# Patient Record
Sex: Female | Born: 1965 | Race: White | Hispanic: No | Marital: Married | Smoking: Never smoker
Health system: Southern US, Community
[De-identification: ages and names within clinical notes are randomized; demographics above are authoritative.]

## PROBLEM LIST (undated history)

## (undated) ENCOUNTER — Emergency Department (HOSPITAL_COMMUNITY): Admission: EM | Payer: BC Managed Care – PPO | Source: Home / Self Care

## (undated) DIAGNOSIS — D649 Anemia, unspecified: Secondary | ICD-10-CM

## (undated) DIAGNOSIS — U071 COVID-19: Secondary | ICD-10-CM

## (undated) DIAGNOSIS — E876 Hypokalemia: Secondary | ICD-10-CM

## (undated) DIAGNOSIS — G43909 Migraine, unspecified, not intractable, without status migrainosus: Secondary | ICD-10-CM

## (undated) DIAGNOSIS — N83202 Unspecified ovarian cyst, left side: Secondary | ICD-10-CM

## (undated) DIAGNOSIS — T7840XA Allergy, unspecified, initial encounter: Secondary | ICD-10-CM

## (undated) DIAGNOSIS — J189 Pneumonia, unspecified organism: Secondary | ICD-10-CM

## (undated) HISTORY — DX: COVID-19: U07.1

## (undated) HISTORY — DX: Migraine, unspecified, not intractable, without status migrainosus: G43.909

## (undated) HISTORY — DX: Unspecified ovarian cyst, left side: N83.202

## (undated) HISTORY — PX: CHOLECYSTECTOMY: SHX55

## (undated) HISTORY — DX: Anemia, unspecified: D64.9

## (undated) HISTORY — DX: Pneumonia, unspecified organism: J18.9

## (undated) HISTORY — DX: Hypokalemia: E87.6

## (undated) HISTORY — DX: Allergy, unspecified, initial encounter: T78.40XA

## (undated) HISTORY — PX: FOOT SURGERY: SHX648

---

## 1994-06-04 HISTORY — PX: PELVIC LAPAROSCOPY: SHX162

## 1996-04-29 LAB — US OB TRANSVAGINAL

## 1997-06-04 HISTORY — PX: BREAST SURGERY: SHX581

## 1998-02-18 ENCOUNTER — Ambulatory Visit (HOSPITAL_BASED_OUTPATIENT_CLINIC_OR_DEPARTMENT_OTHER): Admission: RE | Admit: 1998-02-18 | Discharge: 1998-02-18 | Payer: Self-pay | Admitting: Surgery

## 1998-04-11 ENCOUNTER — Other Ambulatory Visit: Admission: RE | Admit: 1998-04-11 | Discharge: 1998-04-11 | Payer: Self-pay | Admitting: Obstetrics and Gynecology

## 1999-05-16 ENCOUNTER — Other Ambulatory Visit: Admission: RE | Admit: 1999-05-16 | Discharge: 1999-05-16 | Payer: Self-pay | Admitting: Obstetrics and Gynecology

## 2000-04-08 ENCOUNTER — Other Ambulatory Visit: Admission: RE | Admit: 2000-04-08 | Discharge: 2000-04-08 | Payer: Self-pay | Admitting: Obstetrics and Gynecology

## 2000-10-11 ENCOUNTER — Other Ambulatory Visit: Admission: RE | Admit: 2000-10-11 | Discharge: 2000-10-11 | Payer: Self-pay | Admitting: Gynecology

## 2001-04-17 ENCOUNTER — Inpatient Hospital Stay (HOSPITAL_COMMUNITY): Admission: AD | Admit: 2001-04-17 | Discharge: 2001-04-20 | Payer: Self-pay | Admitting: Gynecology

## 2001-05-30 ENCOUNTER — Other Ambulatory Visit: Admission: RE | Admit: 2001-05-30 | Discharge: 2001-05-30 | Payer: Self-pay | Admitting: Gynecology

## 2002-04-01 ENCOUNTER — Other Ambulatory Visit: Admission: RE | Admit: 2002-04-01 | Discharge: 2002-04-01 | Payer: Self-pay | Admitting: Gynecology

## 2003-12-14 ENCOUNTER — Other Ambulatory Visit: Admission: RE | Admit: 2003-12-14 | Discharge: 2003-12-14 | Payer: Self-pay | Admitting: Gynecology

## 2004-06-16 ENCOUNTER — Ambulatory Visit: Payer: Self-pay | Admitting: Internal Medicine

## 2004-08-01 ENCOUNTER — Ambulatory Visit: Payer: Self-pay | Admitting: Internal Medicine

## 2005-01-01 ENCOUNTER — Other Ambulatory Visit: Admission: RE | Admit: 2005-01-01 | Discharge: 2005-01-01 | Payer: Self-pay | Admitting: Gynecology

## 2005-02-09 ENCOUNTER — Ambulatory Visit: Payer: Self-pay | Admitting: Internal Medicine

## 2005-04-25 ENCOUNTER — Ambulatory Visit: Payer: Self-pay | Admitting: Internal Medicine

## 2005-05-25 ENCOUNTER — Ambulatory Visit: Payer: Self-pay | Admitting: Internal Medicine

## 2006-01-15 ENCOUNTER — Ambulatory Visit: Payer: Self-pay | Admitting: Internal Medicine

## 2006-03-04 ENCOUNTER — Other Ambulatory Visit: Admission: RE | Admit: 2006-03-04 | Discharge: 2006-03-04 | Payer: Self-pay | Admitting: Gynecology

## 2006-07-12 ENCOUNTER — Ambulatory Visit: Payer: Self-pay | Admitting: Internal Medicine

## 2006-11-26 ENCOUNTER — Ambulatory Visit: Payer: Self-pay | Admitting: Internal Medicine

## 2006-12-12 ENCOUNTER — Ambulatory Visit: Payer: Self-pay | Admitting: Internal Medicine

## 2006-12-12 ENCOUNTER — Encounter: Payer: Self-pay | Admitting: Internal Medicine

## 2006-12-12 DIAGNOSIS — N809 Endometriosis, unspecified: Secondary | ICD-10-CM | POA: Insufficient documentation

## 2006-12-19 ENCOUNTER — Ambulatory Visit: Payer: Self-pay | Admitting: Gastroenterology

## 2006-12-19 LAB — CONVERTED CEMR LAB
BUN: 6 mg/dL (ref 6–23)
Basophils Absolute: 0.1 10*3/uL (ref 0.0–0.1)
Bilirubin, Direct: 0.1 mg/dL (ref 0.0–0.3)
CO2: 33 meq/L — ABNORMAL HIGH (ref 19–32)
Eosinophils Absolute: 0 10*3/uL (ref 0.0–0.6)
GFR calc Af Amer: 143 mL/min
GFR calc non Af Amer: 118 mL/min
Glucose, Bld: 89 mg/dL (ref 70–99)
HCT: 36.4 % (ref 36.0–46.0)
MCHC: 33.5 g/dL (ref 30.0–36.0)
MCV: 93.4 fL (ref 78.0–100.0)
Monocytes Absolute: 0.4 10*3/uL (ref 0.2–0.7)
Monocytes Relative: 5.7 % (ref 3.0–11.0)
Neutrophils Relative %: 67.8 % (ref 43.0–77.0)
Potassium: 3.5 meq/L (ref 3.5–5.1)
RBC: 3.9 M/uL (ref 3.87–5.11)
Saturation Ratios: 23 % (ref 20.0–50.0)
Sed Rate: 6 mm/hr (ref 0–25)
Total Protein: 7.1 g/dL (ref 6.0–8.3)
Transferrin: 304.4 mg/dL (ref >=212.0)
Vitamin B-12: 1177 pg/mL — ABNORMAL HIGH (ref 211–911)

## 2006-12-25 ENCOUNTER — Ambulatory Visit: Payer: Self-pay | Admitting: Gastroenterology

## 2007-01-10 ENCOUNTER — Ambulatory Visit: Payer: Self-pay | Admitting: Gastroenterology

## 2007-03-14 ENCOUNTER — Other Ambulatory Visit: Admission: RE | Admit: 2007-03-14 | Discharge: 2007-03-14 | Payer: Self-pay | Admitting: Gynecology

## 2007-08-08 ENCOUNTER — Ambulatory Visit: Payer: Self-pay | Admitting: Internal Medicine

## 2007-08-08 DIAGNOSIS — J45909 Unspecified asthma, uncomplicated: Secondary | ICD-10-CM | POA: Insufficient documentation

## 2007-08-08 DIAGNOSIS — J069 Acute upper respiratory infection, unspecified: Secondary | ICD-10-CM | POA: Insufficient documentation

## 2007-08-08 DIAGNOSIS — J309 Allergic rhinitis, unspecified: Secondary | ICD-10-CM | POA: Insufficient documentation

## 2007-09-06 DIAGNOSIS — K649 Unspecified hemorrhoids: Secondary | ICD-10-CM | POA: Insufficient documentation

## 2007-09-06 DIAGNOSIS — Z8719 Personal history of other diseases of the digestive system: Secondary | ICD-10-CM | POA: Insufficient documentation

## 2007-12-25 ENCOUNTER — Encounter: Payer: Self-pay | Admitting: Internal Medicine

## 2008-01-06 ENCOUNTER — Ambulatory Visit: Payer: Self-pay | Admitting: Internal Medicine

## 2008-01-20 ENCOUNTER — Ambulatory Visit: Payer: Self-pay | Admitting: Internal Medicine

## 2008-01-22 ENCOUNTER — Telehealth: Payer: Self-pay | Admitting: Internal Medicine

## 2008-01-30 ENCOUNTER — Ambulatory Visit: Payer: Self-pay | Admitting: Internal Medicine

## 2008-02-26 ENCOUNTER — Telehealth: Payer: Self-pay | Admitting: Internal Medicine

## 2008-05-03 ENCOUNTER — Ambulatory Visit: Payer: Self-pay | Admitting: Internal Medicine

## 2008-06-04 ENCOUNTER — Emergency Department (HOSPITAL_COMMUNITY): Admission: EM | Admit: 2008-06-04 | Discharge: 2008-06-04 | Payer: Self-pay | Admitting: Emergency Medicine

## 2008-06-14 ENCOUNTER — Ambulatory Visit: Payer: Self-pay | Admitting: Gynecology

## 2008-06-14 ENCOUNTER — Encounter: Payer: Self-pay | Admitting: Gynecology

## 2008-06-14 ENCOUNTER — Other Ambulatory Visit: Admission: RE | Admit: 2008-06-14 | Discharge: 2008-06-14 | Payer: Self-pay | Admitting: Gynecology

## 2008-07-27 ENCOUNTER — Encounter: Payer: Self-pay | Admitting: Internal Medicine

## 2008-07-29 ENCOUNTER — Telehealth: Payer: Self-pay | Admitting: Internal Medicine

## 2008-08-03 ENCOUNTER — Ambulatory Visit: Payer: Self-pay | Admitting: Internal Medicine

## 2008-08-03 DIAGNOSIS — H53129 Transient visual loss, unspecified eye: Secondary | ICD-10-CM | POA: Insufficient documentation

## 2008-08-19 ENCOUNTER — Encounter: Payer: Self-pay | Admitting: Internal Medicine

## 2008-08-19 ENCOUNTER — Ambulatory Visit: Payer: Self-pay

## 2008-10-08 ENCOUNTER — Ambulatory Visit: Payer: Self-pay | Admitting: Gynecology

## 2008-10-25 ENCOUNTER — Encounter: Payer: Self-pay | Admitting: Internal Medicine

## 2008-11-02 ENCOUNTER — Ambulatory Visit: Payer: Self-pay | Admitting: Gynecology

## 2008-12-22 ENCOUNTER — Ambulatory Visit (HOSPITAL_COMMUNITY): Admission: RE | Admit: 2008-12-22 | Discharge: 2008-12-22 | Payer: Self-pay | Admitting: Gastroenterology

## 2008-12-28 ENCOUNTER — Encounter: Payer: Self-pay | Admitting: Family Medicine

## 2008-12-29 ENCOUNTER — Ambulatory Visit: Payer: Self-pay | Admitting: Family Medicine

## 2008-12-29 DIAGNOSIS — R634 Abnormal weight loss: Secondary | ICD-10-CM | POA: Insufficient documentation

## 2008-12-29 DIAGNOSIS — E876 Hypokalemia: Secondary | ICD-10-CM | POA: Insufficient documentation

## 2008-12-29 DIAGNOSIS — R209 Unspecified disturbances of skin sensation: Secondary | ICD-10-CM | POA: Insufficient documentation

## 2008-12-29 DIAGNOSIS — R197 Diarrhea, unspecified: Secondary | ICD-10-CM | POA: Insufficient documentation

## 2008-12-30 ENCOUNTER — Encounter: Payer: Self-pay | Admitting: Internal Medicine

## 2008-12-30 ENCOUNTER — Telehealth: Payer: Self-pay | Admitting: Family Medicine

## 2008-12-30 ENCOUNTER — Ambulatory Visit: Payer: Self-pay | Admitting: Family Medicine

## 2008-12-31 LAB — CONVERTED CEMR LAB
Basophils Absolute: 0.1 10*3/uL (ref 0.0–0.1)
Bilirubin, Direct: 0.1 mg/dL (ref 0.0–0.3)
CO2: 30 meq/L (ref 19–32)
Calcium: 9.4 mg/dL (ref 8.4–10.5)
Creatinine, Ser: 0.6 mg/dL (ref 0.4–1.2)
Eosinophils Absolute: 0.1 10*3/uL (ref 0.0–0.7)
GFR calc non Af Amer: 116.51 mL/min (ref >60)
HCT: 41.4 % (ref 36.0–46.0)
Hemoglobin: 14.2 g/dL (ref 12.0–15.0)
Lymphs Abs: 2.9 10*3/uL (ref 0.7–4.0)
MCHC: 34.4 g/dL (ref 30.0–36.0)
MCV: 92.8 fL (ref 78.0–100.0)
Monocytes Absolute: 0.7 10*3/uL (ref 0.1–1.0)
Neutro Abs: 4.7 10*3/uL (ref 1.4–7.7)
RDW: 12.2 % (ref 11.5–14.6)
TSH: 1.47 microintl units/mL (ref 0.35–5.50)
Total Protein: 7.3 g/dL (ref 6.0–8.3)
Vitamin B-12: 327 pg/mL (ref 211–911)

## 2009-01-07 ENCOUNTER — Ambulatory Visit (HOSPITAL_COMMUNITY): Admission: RE | Admit: 2009-01-07 | Discharge: 2009-01-07 | Payer: Self-pay | Admitting: Gastroenterology

## 2009-01-28 ENCOUNTER — Encounter: Payer: Self-pay | Admitting: Internal Medicine

## 2009-02-16 ENCOUNTER — Encounter (INDEPENDENT_AMBULATORY_CARE_PROVIDER_SITE_OTHER): Payer: Self-pay | Admitting: General Surgery

## 2009-02-16 ENCOUNTER — Ambulatory Visit (HOSPITAL_COMMUNITY): Admission: RE | Admit: 2009-02-16 | Discharge: 2009-02-16 | Payer: Self-pay | Admitting: General Surgery

## 2009-02-17 ENCOUNTER — Ambulatory Visit: Payer: Self-pay | Admitting: Vascular Surgery

## 2009-02-17 ENCOUNTER — Ambulatory Visit: Admission: RE | Admit: 2009-02-17 | Discharge: 2009-02-17 | Payer: Self-pay | Admitting: General Surgery

## 2009-02-17 ENCOUNTER — Encounter (INDEPENDENT_AMBULATORY_CARE_PROVIDER_SITE_OTHER): Payer: Self-pay | Admitting: General Surgery

## 2009-03-08 ENCOUNTER — Encounter: Payer: Self-pay | Admitting: Internal Medicine

## 2009-04-08 ENCOUNTER — Ambulatory Visit: Payer: Self-pay | Admitting: Internal Medicine

## 2009-04-12 ENCOUNTER — Ambulatory Visit: Payer: Self-pay | Admitting: Internal Medicine

## 2009-04-12 ENCOUNTER — Telehealth: Payer: Self-pay | Admitting: Internal Medicine

## 2009-04-12 LAB — CONVERTED CEMR LAB: Potassium: 3.9 meq/L (ref 3.5–5.1)

## 2009-05-03 ENCOUNTER — Encounter (INDEPENDENT_AMBULATORY_CARE_PROVIDER_SITE_OTHER): Payer: Self-pay | Admitting: *Deleted

## 2009-05-23 ENCOUNTER — Encounter: Payer: Self-pay | Admitting: Internal Medicine

## 2009-06-17 ENCOUNTER — Ambulatory Visit: Payer: Self-pay | Admitting: Internal Medicine

## 2009-06-22 ENCOUNTER — Telehealth: Payer: Self-pay | Admitting: Internal Medicine

## 2009-06-23 ENCOUNTER — Encounter: Payer: Self-pay | Admitting: Internal Medicine

## 2009-09-22 ENCOUNTER — Telehealth: Payer: Self-pay | Admitting: Internal Medicine

## 2009-10-13 ENCOUNTER — Ambulatory Visit: Payer: Self-pay | Admitting: Internal Medicine

## 2009-10-13 LAB — CONVERTED CEMR LAB
AST: 28 units/L (ref 0–37)
Albumin: 4.7 g/dL (ref 3.5–5.2)
Alkaline Phosphatase: 52 units/L (ref 39–117)
Barbiturate Quant, Ur: NEGATIVE
Basophils Relative: 0.5 % (ref 0.0–3.0)
Blood in Urine, dipstick: NEGATIVE
Calcium: 9.5 mg/dL (ref 8.4–10.5)
Cocaine Metabolites: NEGATIVE
Creatinine,U: 265.5 mg/dL
GFR calc non Af Amer: 102.19 mL/min (ref >60)
HDL: 51.8 mg/dL (ref >39.00)
Hemoglobin: 14.2 g/dL (ref 12.0–15.0)
Lymphocytes Relative: 31.4 % (ref 12.0–46.0)
Marijuana Metabolite: NEGATIVE
Monocytes Relative: 8.2 % (ref 3.0–12.0)
Neutro Abs: 4.8 10*3/uL (ref 1.4–7.7)
Nitrite: NEGATIVE
Opiate Screen, Urine: NEGATIVE
Propoxyphene: NEGATIVE
RBC: 4.57 M/uL (ref 3.87–5.11)
Sodium: 142 meq/L (ref 135–145)
Specific Gravity, Urine: >=1.03
Total CHOL/HDL Ratio: 4
Total Protein: 6.9 g/dL (ref 6.0–8.3)
VLDL: 29.8 mg/dL (ref 0.0–40.0)
WBC Urine, dipstick: NEGATIVE
WBC: 8.1 10*3/uL (ref 4.5–10.5)

## 2009-10-20 ENCOUNTER — Ambulatory Visit: Payer: Self-pay | Admitting: Internal Medicine

## 2009-10-25 ENCOUNTER — Ambulatory Visit: Payer: Self-pay | Admitting: Internal Medicine

## 2009-10-27 ENCOUNTER — Ambulatory Visit: Payer: Self-pay | Admitting: Internal Medicine

## 2009-11-17 ENCOUNTER — Other Ambulatory Visit: Admission: RE | Admit: 2009-11-17 | Discharge: 2009-11-17 | Payer: Self-pay | Admitting: Gynecology

## 2009-11-17 ENCOUNTER — Ambulatory Visit: Payer: Self-pay | Admitting: Gynecology

## 2009-11-22 ENCOUNTER — Ambulatory Visit: Payer: Self-pay | Admitting: Internal Medicine

## 2009-11-25 ENCOUNTER — Ambulatory Visit: Payer: Self-pay | Admitting: Gynecology

## 2010-03-17 ENCOUNTER — Ambulatory Visit: Payer: Self-pay | Admitting: Internal Medicine

## 2010-05-08 IMAGING — NM NM HEPATO W/GB/PHARM/[PERSON_NAME]
3 series · 13 of 13 positions shown · non-contrast
Comparison: [HOSPITAL] abdominal ultrasound 01/07/2009.

CLINICAL DATA: Nausea and vomiting.  Abdominal pain.

NUCLEAR MEDICINE HEPATOBILIARY IMAGING WITH GALLBLADDER EF
TECHNIQUE: Sequential images of the abdomen were obtained [DATE]
minutes following intravenous administration of
radiopharmaceutical.  After slow intravenous infusion of 0.88 uCg
Cholecystokinin, gallbladder ejection fraction was determined.
Radiopharmaceutical:  5.2 mCi Ic-77m Choletec

[he hepatobiliary · 3.43mm/px · 6 of 44 frames shown (1 of 3)]
[frame 4/44]
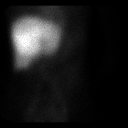
[frame 11/44]
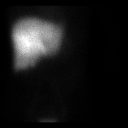
[frame 19/44]
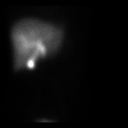
[frame 26/44]
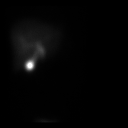
[frame 33/44]
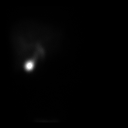
[frame 41/44]
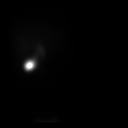

[he hepatobiliary · 3.43mm/px · 6 of 30 frames shown (2 of 3)]
[frame 3/30]
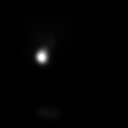
[frame 8/30]
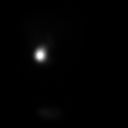
[frame 13/30]
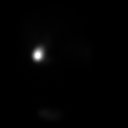
[frame 18/30]
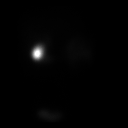
[frame 23/30]
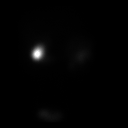
[frame 28/30]
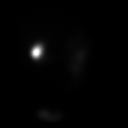

[he hepatobiliary · 1 of 1 slices shown (3 of 3)]
[im 1/1]
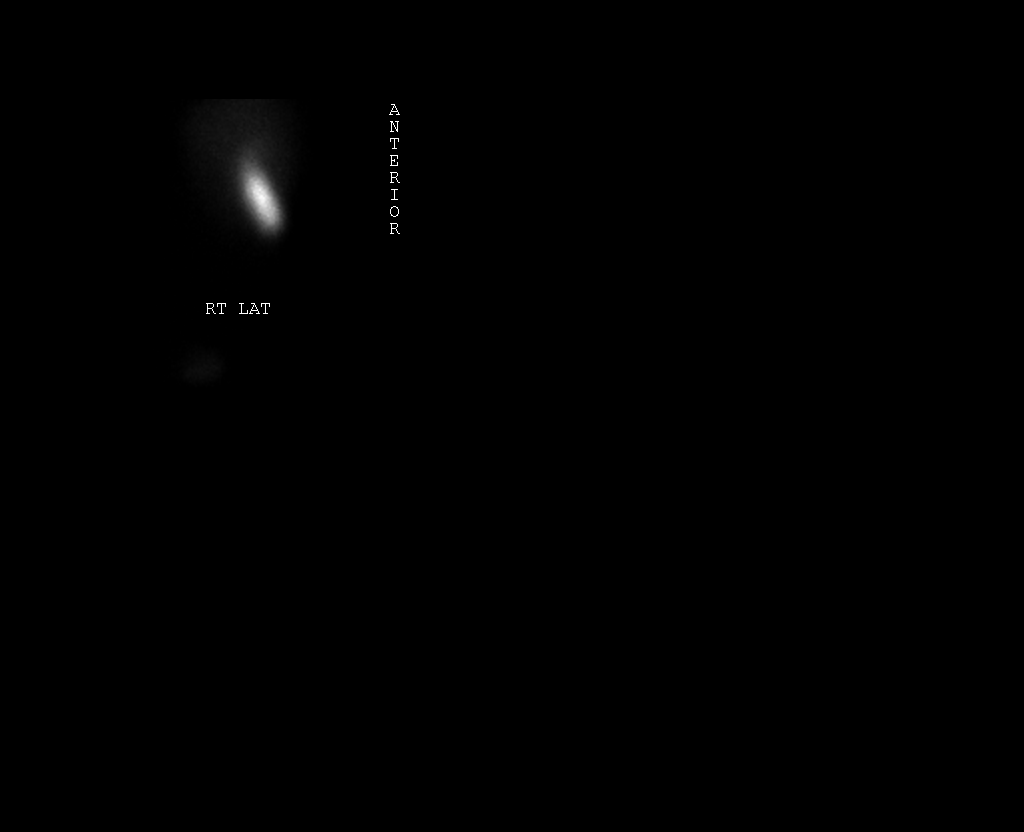

[13 of 13 positions shown; findings below may reference images not displayed]

FINDINGS: Normal hepatic uptake and excretion of tracer is seen
with gallbladder activity visualized at 15 minutes delay and
throughout study.  Small bowel activity is seen at 35 minutes delay
and throughout study.  Gallbladder ejection fraction is decreased
at 19% over 30 minutes (normal gallbladder ejection fraction over
30 minutes as greater than 30%).

The patient did not experience symptoms during CCK infusion.
IMPRESSION: 1.  Abnormal decreased gallbladder ejection fraction at 19%.
2.  Otherwise, negative.

## 2010-07-04 NOTE — Letter (Signed)
Summary: [facility]  [facility]   Imported By: Maryln Gottron 06/08/2009 09:43:45  _____________________________________________________________________  External Attachment:    Type:   Image     Comment:   External Document

## 2010-07-04 NOTE — Assessment & Plan Note (Signed)
Summary: hep Ab//ccm   Nurse Visit   Allergies: 1)  ! Sulf-10 2)  ! Prednisone  Immunizations Administered:  Hepatitis B Vaccine # 2:    Vaccine Type: HepB Adolescent    Site: right deltoid    Mfr: Merck    Dose: 1.0 ml    Route: IM    Given by: Duard Brady LPN    Exp. Date: 09/01/2011    Lot #: [id number]    VIS given: 12/19/05 version given November 22, 2009.    Physician counseled: yes  Orders Added: 1)  Hepatitis B Vaccine ADOLESCENT (2 dose) [90743] 2)  Admin 1st Vaccine [24235]

## 2010-07-04 NOTE — Letter (Signed)
Summary: Immunization/Shot Record  [place] [facility]  [address] [place], [place] [address]   Phone: [phone]  Fax: [phone]     Immunization Record for: Astha L Hamm  Vaccine 1 2 3 4 5 6  HepB Hepatitis B 10/25/2009      11/22/2009               DTP Diphtheria, Tetanus, Pertussis                         HIB Haemophilus influenzae Type b                 DGUYQIHKVQ IPV Inactivated Poliovirus             MMR Measles, Mumps, Rubella 12/12/2006   Marguerite Olea QVZDGLOVFI EPPIRJJOAC Varicella Varivax    ZYSAYTKZSW FUXNATFTDD UKGURKYHCW Pneumococcal           Hep A Hepatitis A   CBJSEGBTDV VOHYWVPXTG GYIRSWNIOE VOJJKKXFGH        Tetanus Booster Date of Last: Td 11/26/2006  Flu Shot Date of Last:  Pneumovax Date of Last:  Meningococcal Vaccine Given:       Other Vaccines HPV Vaccine/ Date of Last:    Vaccine/ Date of Last:    Vaccine/ Date of Last:     Marguerite Olea  WEXHBZJIRC  VELFYBOFBP Rotavirus Vaccine/ Date of Last:    Vaccine/ Date of Last:    Vaccine/ Date of Last:     ZWCHENIDPO  [facility]  EUMPNTIRWE Zostavax Vaccine/ Date of Last:     Marguerite Olea  RXVQMGQQPY  Marguerite Olea  PPJKDTOIZT  IWPYKDXIPJ  Recommended Childhood and Adolescent Immunization Schedule [place]  2006 Vaccine Age Birth 1 mos 2 mos 4 mos 6 mos 12 mos 15 mos 18 mos 24 mos 4-6 yrs 11-12 yrs 13-14 yrs 15 yrs 16-18 yrs Hepatitis B1 HepB HepB HepB1  HepB  HepB Series Catch-Up Diphtheria, Tetanus, Pertussis2   DTaP DTaP DTaP   DTaP  DTaP Tdap  Tdap Catch-Up Haemophilus influenzae type b3   Hib Hib Hib3 Hib        Inactivated Poliovirus   IPV IPV  IPV   IPV     Measles, Mumps, Rubella4      MMR   MMR M MR MMR Catch-Up Varicella5       Varicella  Varicella  Catch-Up Meningo-coccal6           MCV4  MCV4 CatchUpV4           MPSV4 for High Risk Groups  C MCV4 for High Risk Groups Pneumo-coccal7   PCV PCV PCV PCV   PCV  Catch-Up PPV for High Risk Groups         PPV for High Risk Groups  Influenza8      Influenza (Yearly)  Influenza (Yearly) for High Risk Groups Hepatitis A9       HepA Series  This schedule indicates the recommended ages for routine administration of currently licensed childhood vaccines, as of May 04, 2004, for children through age 71 years. Any dose not administered at the recommended age should be administered at any subsequent visit when indicated and feasible. Indicates age groups that warrant special effort to administer those vaccines not previously administered. Additional vaccines may be licensed and recommended during the year. Licensed combination vaccines may be used whenever any components of the combination are indicated and other components of the vaccine are not contraindicated and if approved by the Food  and Drug Administration for that dose of the series. Providers should consult the respective ACIP statement for detailed recommendations. Clinically significant adverse events that follow immunization should be reported to the Vaccine Adverse Event Reporting System (VAERS). Guidance about how to obtain and complete a VAERS form is available at www.vaers.LAgents.no or by telephone, (475)[phone].  The Childhood and Adolescent Immunization Schedule is approved by: Advisory Committee on Administrator http://www.wade.com/   American Academy of Pediatrics BridgeDigest.com.cy   American Academy [employer] American.aafp.org

## 2010-07-04 NOTE — Letter (Signed)
Summary: [employer]  [employer]   Imported By: Maryln Gottron 11/02/2009 15:46:55  _____________________________________________________________________  External Attachment:    Type:   Image     Comment:   External Document

## 2010-07-04 NOTE — Progress Notes (Signed)
Summary: REQ FOR LABWORK?  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 However, pt adv that she would need to have drug test performed for Nursing School Admission criteria and wants to know if same can be done at time of her cpx lab appt on Oct 13, 2009...Marland Kitchen??  Initial call taken by: Debbra Riding,  September 22, 2009 9:42 AM  Follow-up for Phone Call        yes Follow-up by: Gordy Savers  MD,  September 22, 2009 12:40 PM

## 2010-07-04 NOTE — Letter (Signed)
Summary: [facility]  [facility]   Imported By: Maryln Gottron 07/01/2009 12:27:03  _____________________________________________________________________  External Attachment:    Type:   Image     Comment:   External Document

## 2010-07-04 NOTE — Progress Notes (Signed)
Summary: pt requesting antibiotic  Phone Note Call from Patient   Caller: Patient Call For: Gordy Savers  MD Summary of Call: Still having symptoms of sore throat, cough, and sob, and wants an antibiotic??? Taking Tussionex at night. [facility]) [phone] Initial call taken by: Lynann Beaver CMA,  June 22, 2009 8:23 AM    please explain that viral illness (son diagnosed with influenza) not helped by antibiotics.  Encourage Advair use two times a day   Pt notified.  Appended Document: pt requesting antibiotic Pt is calling again for an antibiotic.......ran a fever 101 all night, and is has productive cough....Marland KitchenMarland Kitchenfeels terrible. [facility]) [phone]  Appended Document: pt requesting antibiotic Per Dr. Kirtland Bouchard......Marland KitchenZpack ok.  Sent to [facility]) and pt notified by voice mail.

## 2010-07-04 NOTE — Assessment & Plan Note (Signed)
Summary: FLU-LIKE SYMPTOMS // RS   Vital Signs:  Patient profile:   45 year old female Weight:      116 pounds Temp:     98.4 degrees F oral BP sitting:   120 / 88  (left arm) Cuff size:   regular  Vitals Entered By: Raechel Ache, RN (June 17, 2009 9:18 AM) CC: C/o sweats, chills, sore throat, congestion, cough.   CC:  C/o sweats, chills, sore throat, congestion, and cough..  History of Present Illness: 45 year old patient, who presents with a 5-day history of sore throat, low grade fever, chills, chest congestion.  She has a history of asthma, but has had no wheezing.  Denies any productive cough.  He states his son has been diagnosed with influenza B. her most prominent symptom.  Probably is sore throat.  Allergies: 1)  ! Sulf-10  Past History:  Past Medical History: Reviewed history from 04/08/2009 and no changes required. Endometriosis, hx of Allergic rhinitis Asthma chronic diarrhea history of hypokalemia  Review of Systems       The patient complains of anorexia, fever, hoarseness, and prolonged cough.  The patient denies weight loss, weight gain, vision loss, decreased hearing, chest pain, syncope, dyspnea on exertion, peripheral edema, headaches, hemoptysis, abdominal pain, melena, hematochezia, severe indigestion/heartburn, hematuria, incontinence, genital sores, muscle weakness, suspicious skin lesions, transient blindness, difficulty walking, depression, unusual weight change, abnormal bleeding, enlarged lymph nodes, angioedema, and breast masses.    Physical Exam  General:  Well-developed,well-nourished,in no acute distress; alert,appropriate and cooperative throughout examination Head:  Normocephalic and atraumatic without obvious abnormalities. No apparent alopecia or balding. Eyes:  No corneal or conjunctival inflammation noted. EOMI. Perrla. Funduscopic exam benign, without hemorrhages, exudates or papilledema. Vision grossly normal. Ears:  External ear  exam shows no significant lesions or deformities.  Otoscopic examination reveals clear canals, tympanic membranes are intact bilaterally without bulging, retraction, inflammation or discharge. Hearing is grossly normal bilaterally. Mouth:  pharyngeal erythema.  pharyngeal erythema.   Neck:  No deformities, masses, or tenderness noted. mild anterior cervical adenopathy Lungs:  Normal respiratory effort, chest expands symmetrically. Lungs are clear to auscultation, no crackles or wheezes. Heart:  Normal rate and regular rhythm. S1 and S2 normal without gallop, murmur, click, rub or other extra sounds.   Impression & Recommendations:  Problem # 1:  URI (ICD-465.9)  Her updated medication list for this problem includes:    Tussionex Pennkinetic Er 8-10 Mg/54ml Lqcr (Chlorpheniramine-hydrocodone) .Marland Kitchen... 1 teaspoon every 12 hours as needed for cough  Her updated medication list for this problem includes:    Tussionex Pennkinetic Er 8-10 Mg/16ml Lqcr (Chlorpheniramine-hydrocodone) .Marland Kitchen... 1 teaspoon every 12 hours as needed for cough  Complete Medication List: 1)  Advair Diskus 250-50 Mcg/dose Misc (Fluticasone-salmeterol) .... Use as needed 2)  Potassium Chloride Crys Cr 20 Meq Cr-tabs (Potassium chloride crys cr) .... Two twice daily for one week, then one twice daily 3)  Tussionex Pennkinetic Er 8-10 Mg/11ml Lqcr (Chlorpheniramine-hydrocodone) .Marland Kitchen.. 1 teaspoon every 12 hours as needed for cough  Patient Instructions: 1)  Get plenty of rest, drink lots of clear liquids, and use Tylenol or Ibuprofen for fever and comfort. Return in 7-10 days if you're not better:sooner if you're feeling worse. 2)  Take 400-600mg  of Ibuprofen (Advil, Motrin) with food every 4-6 hours as needed for relief of pain or comfort of fever. Prescriptions: TUSSIONEX PENNKINETIC ER 8-10 MG/5ML LQCR (CHLORPHENIRAMINE-HYDROCODONE) 1 teaspoon every 12 hours as needed for cough  #4 oz x 0  Entered and Authorized by:   Gordy Savers  MD   Signed by:   Gordy Savers  MD on 06/17/2009   Method used:   Print then Give to Patient   RxID:   [id number]

## 2010-07-04 NOTE — Assessment & Plan Note (Signed)
Summary: tb reading//ccm/pt rescd//ccm   Nurse Visit   Allergies: 1)  ! Sulf-10 2)  ! Prednisone  PPD Results    Date of reading: 10/27/2009    Results: < 5mm    Interpretation: negative

## 2010-07-04 NOTE — Assessment & Plan Note (Signed)
Summary: HEP B INJ // RS   Nurse Visit   Allergies: 1)  ! Sulf-10 2)  ! Prednisone  Immunizations Administered:  Hepatitis B Vaccine # 1:    Vaccine Type: HepB Adult    Site: right deltoid    Mfr: Merck    Dose: 0.5 ml    Route: IM    Given by: Duard Brady LPN    Exp. Date: 09/01/2011    Lot #: [id number]    VIS given: 12/19/05 version given Oct 25, 2009.    Physician counseled: yes  PPD Skin Test:    Vaccine Type: PPD    Site: right forearm    Mfr: Sanofi Pasteur    Dose: 0.1 ml    Route: ID    Given by: Duard Brady LPN    Exp. Date: 03/17/2011    Lot #: [id number]    Physician counseled: yes  Orders Added: 1)  Hepatitis B Vaccine >67yrs [90746] 2)  Admin 1st Vaccine [90471] 3)  TB Skin Test [86580] 4)  Admin of Any Addtl Vaccine [90472]  Appended Document: HEP B INJ // RS     Clinical Lists Changes  Observations: Added new observation of MMR #1: MMR (12/12/2006 16:04) Added new observation of TD BOOSTER: Td (11/26/2006 16:03)       Immunization History:  Tetanus/Td Immunization History:    Tetanus/Td:  td (11/26/2006)  MMR Immunization History:    MMR # 1:  mmr (12/12/2006)

## 2010-07-04 NOTE — Assessment & Plan Note (Signed)
Summary: hep b and flu shot/cjr   Nurse Visit   Allergies: 1)  ! Sulf-10 2)  ! Prednisone  Appended Document: hep b and flu shot/cjr     Nurse Visit   Allergies: 1)  ! Sulf-10 2)  ! Prednisone  Immunizations Administered:  Influenza Vaccine # 1:    Vaccine Type: Fluvax 3+    Site: right deltoid    Mfr: GlaxoSmithKline    Dose: 0.5 ml    Route: IM    Given by: Duard Brady LPN    Exp. Date: 12/02/2010    Lot #: [id number]    VIS given: 12/27/09 version given March 17, 2010.    Physician counseled: yes  Hepatitis B Vaccine # 3:    Vaccine Type: HepB Adult    Site: left deltoid    Mfr: GlaxoSmithKline    Dose: 0.5 ml    Route: IM    Given by: Duard Brady LPN    Exp. Date: 09/06/2012    Lot #: [id number]    VIS given: 12/19/05 version given March 17, 2010.    Physician counseled: yes  Flu Vaccine Consent Questions:    Do you have a history of severe allergic reactions to this vaccine? no    Any prior history of allergic reactions to egg and/or gelatin? no    Do you have a sensitivity to the preservative Thimersol? no    Do you have a past history of Guillan-Barre Syndrome? no    Do you currently have an acute febrile illness? no    Have you ever had a severe reaction to latex? no    Vaccine information given and explained to patient? yes    Are you currently pregnant? no  Orders Added: 1)  Flu Vaccine 30yrs + [90658] 2)  Admin 1st Vaccine [90471] 3)  Hepatitis B Vaccine >22yrs [90746] 4)  Admin of Any Addtl Vaccine [30865]

## 2010-07-04 NOTE — Assessment & Plan Note (Signed)
Summary: CPX [facility]) // RS---   Vital Signs:  Patient profile:   45 year old female Height:      61 inches Weight:      114 pounds BMI:     21.62 Temp:     98.4 degrees F oral BP sitting:   108 / 70  (left arm) Cuff size:   regular  Vitals Entered By: Duard Brady LPN (Oct 20, 2009 1:26 PM) CC: cpx for school Is Patient Diabetic? No  Vision Screening:Left eye with correction: 20 / 20 Right eye with correction: 20 / 20        Vision Entered By: Duard Brady LPN (Oct 20, 2009 1:29 PM)   CC:  cpx for school.  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  She has been  at both [employer] as well as [facility] but there are no immunization records available on EMR.  She has a history of asthma, allergic rhinitis.  She has seen Dr. Ezzard Standing in the past.  She does have a two week history of cough, sore throat, congestion, and some fullness in the right ear  Preventive Screening-Counseling & Management  Alcohol-Tobacco     Smoking Status: never  Allergies: 1)  ! Sulf-10 2)  ! Prednisone  Past History:  Past Medical History: Reviewed history from 04/08/2009 and no changes required. Endometriosis, hx of Allergic rhinitis Asthma chronic diarrhea history of hypokalemia  Past Surgical History: Reviewed history from 12/12/2006 and no changes required. foot surgery GYN surgery  Family History: Reviewed history from 08/08/2007 and no changes required. both parents with a history of hypertension maternal  grandmother history of cervical cancer and history of breast cancer  One brother one sister in good health  Maternal grandfather with diabetes  Social History: Reviewed history from 08/08/2007 and no changes required. Married  Review of Systems       The patient complains of hoarseness and prolonged cough.  The patient denies anorexia,  fever, weight loss, weight gain, vision loss, decreased hearing, chest pain, syncope, dyspnea on exertion, peripheral edema, headaches, hemoptysis, abdominal pain, melena, hematochezia, severe indigestion/heartburn, hematuria, incontinence, genital sores, muscle weakness, suspicious skin lesions, transient blindness, difficulty walking, depression, unusual weight change, abnormal bleeding, enlarged lymph nodes, angioedema, and breast masses.    Physical Exam  General:  Well-developed,well-nourished,in no acute distress; alert,appropriate and cooperative throughout examination Head:  Normocephalic and atraumatic without obvious abnormalities. No apparent alopecia or balding. Eyes:  No corneal or conjunctival inflammation noted. EOMI. Perrla. Funduscopic exam benign, without hemorrhages, exudates or papilledema. Vision grossly normal. Ears:  the right canal is slightly occluded with cerumen and had a deposit of purpleish material on the floor of the canal Nose:  External nasal examination shows no deformity or inflammation. Nasal mucosa are pink and moist without lesions or exudates. Mouth:  Oral mucosa and oropharynx without lesions or exudates.  Teeth in good repair. Neck:  No deformities, masses, or tenderness noted. Lungs:  Normal respiratory effort, chest expands symmetrically. Lungs are clear to auscultation, no crackles or wheezes. Heart:  Normal rate and regular rhythm. S1 and S2 normal without gallop, murmur, click, rub or other extra sounds. Abdomen:  Bowel sounds positive,abdomen soft and non-tender without masses, organomegaly or hernias noted. Msk:  No deformity or scoliosis noted of thoracic or lumbar spine.   Pulses:  R and L carotid,radial,femoral,dorsalis pedis and posterior tibial pulses are  full and equal bilaterally Extremities:  No clubbing, cyanosis, edema, or deformity noted with normal full range of motion of all joints.     Impression & Recommendations:  Problem # 1:   ASTHMA (ICD-493.90)  Her updated medication list for this problem includes:    Advair Diskus 250-50 Mcg/dose Misc (Fluticasone-salmeterol) ..... Use as needed  Problem # 2:  ALLERGIC RHINITIS (ICD-477.9)  Problem # 3:  Preventive Health Care (ICD-V70.0)  Complete Medication List: 1)  Advair Diskus 250-50 Mcg/dose Misc (Fluticasone-salmeterol) .... Use as needed 2)  Potassium Chloride Crys Cr 20 Meq Cr-tabs (Potassium chloride crys cr) .... Two twice daily for one week, then one twice daily 3)  Tussionex Pennkinetic Er 8-10 Mg/4ml Lqcr (Chlorpheniramine-hydrocodone) .Marland Kitchen.. 1 teaspoon every 12 hours as needed for cough  Other Orders: Venipuncture (04540) T-Rubella Antibody ([id number]) T- * Misc. Laboratory test [id number])  Patient Instructions: 1)  Please schedule a follow-up appointment as needed. 2)  It is important that you exercise regularly at least 20 minutes 5 times a week. If you develop chest pain, have severe difficulty breathing, or feel very tired , stop exercising immediately and seek medical attention. Prescriptions: Sandria Senter ER 8-10 MG/5ML LQCR (CHLORPHENIRAMINE-HYDROCODONE) 1 teaspoon every 12 hours as needed for cough  #4 oz x 0   Entered and Authorized by:   Gordy Savers  MD   Signed by:   Gordy Savers  MD on 10/20/2009   Method used:   Print then Give to Patient   RxID:   [id number]

## 2010-09-08 LAB — DIFFERENTIAL
Lymphocytes Relative: 31 % (ref 12–46)
Lymphs Abs: 2.9 10*3/uL (ref 0.7–4.0)
Monocytes Relative: 6 % (ref 3–12)
Neutro Abs: 5.7 10*3/uL (ref 1.7–7.7)
Neutrophils Relative %: 61 % (ref 43–77)

## 2010-09-08 LAB — POTASSIUM: Potassium: 3.6 mEq/L (ref 3.5–5.1)

## 2010-09-08 LAB — URINALYSIS, ROUTINE W REFLEX MICROSCOPIC
Glucose, UA: NEGATIVE mg/dL
Nitrite: NEGATIVE
Specific Gravity, Urine: 1.025 (ref 1.005–1.030)
pH: 6 (ref 5.0–8.0)

## 2010-09-08 LAB — CBC
Hemoglobin: 13.8 g/dL (ref 12.0–15.0)
MCHC: 34.5 g/dL (ref 30.0–36.0)
MCV: 93.4 fL (ref 78.0–100.0)
RDW: 12.7 % (ref 11.5–15.5)

## 2010-09-08 LAB — COMPREHENSIVE METABOLIC PANEL
ALT: 29 U/L (ref 0–35)
BUN: 6 mg/dL (ref 6–23)
Calcium: 9.1 mg/dL (ref 8.4–10.5)
Creatinine, Ser: 0.72 mg/dL (ref 0.4–1.2)
GFR calc non Af Amer: >60 mL/min (ref >60)
Glucose, Bld: 60 mg/dL — ABNORMAL LOW (ref 70–99)
Sodium: 137 mEq/L (ref 135–145)
Total Protein: 6.4 g/dL (ref 6.0–8.3)

## 2010-09-08 LAB — PREGNANCY, URINE: Preg Test, Ur: NEGATIVE

## 2010-09-19 ENCOUNTER — Encounter: Payer: Self-pay | Admitting: Internal Medicine

## 2010-09-20 ENCOUNTER — Ambulatory Visit (INDEPENDENT_AMBULATORY_CARE_PROVIDER_SITE_OTHER): Payer: BC Managed Care – PPO | Admitting: Internal Medicine

## 2010-09-20 ENCOUNTER — Encounter: Payer: Self-pay | Admitting: Internal Medicine

## 2010-09-20 VITALS — BP 126/78 | HR 107 | Ht 63.0 in | Wt 117.0 lb

## 2010-09-20 DIAGNOSIS — R42 Dizziness and giddiness: Secondary | ICD-10-CM

## 2010-09-20 DIAGNOSIS — E876 Hypokalemia: Secondary | ICD-10-CM

## 2010-09-20 LAB — CBC WITH DIFFERENTIAL/PLATELET
Basophils Absolute: 0 10*3/uL (ref 0.0–0.1)
Basophils Relative: 0.6 % (ref 0.0–3.0)
Eosinophils Absolute: 0.1 10*3/uL (ref 0.0–0.7)
Hemoglobin: 13.4 g/dL (ref 12.0–15.0)
Lymphocytes Relative: 24.7 % (ref 12.0–46.0)
MCHC: 34.7 g/dL (ref 30.0–36.0)
Monocytes Relative: 7.5 % (ref 3.0–12.0)
Neutro Abs: 5.1 10*3/uL (ref 1.4–7.7)
Neutrophils Relative %: 66.3 % (ref 43.0–77.0)
RBC: 4.1 Mil/uL (ref 3.87–5.11)

## 2010-09-20 LAB — BASIC METABOLIC PANEL
BUN: 11 mg/dL (ref 6–23)
CO2: 30 mEq/L (ref 19–32)
Chloride: 102 mEq/L (ref 96–112)
Creatinine, Ser: 0.5 mg/dL (ref 0.4–1.2)
Potassium: 3.6 mEq/L (ref 3.5–5.1)

## 2010-09-20 LAB — TSH: TSH: 0.82 u[IU]/mL (ref 0.35–5.50)

## 2010-09-20 MED ORDER — DOXYCYCLINE HYCLATE 100 MG PO TABS: 100.0000 mg | ORAL_TABLET | Freq: Two times a day (BID) | ORAL | Status: AC

## 2010-09-20 NOTE — Progress Notes (Signed)
  Subjective:    Patient ID: Jamie Cameron, female    DOB: 1966-04-05, 45 y.o.   MRN: [id number]  HPI Patient presents to clinic for evaluation of dizziness. 2 weeks ago after flying developed dizziness. Saw ENT who irrigated years. Notes dizzines sworsened or precipitated by lateral head turning. No presyncope or syncope. Meds approximate 5 days ago developed frontal headache but denies any significant sinus congestion or nasal drainage. Denies fever or chills.Blood pressure reevaluated to be 116/78 and reviewed as normal. Patient recalls history of hypokalemia with potassium previously as low as 2.5. At that time her symptomswere similar. Not taking diuretic therapy. No other alleviating or exacerbating factors. No medication for the problem. No other complaints.  Reviewed past medical history, medications and allergies  Review of Systemssee history of present illness     Objective:   Physical Exam  Nursing note and vitals reviewed. Constitutional: She appears well-developed and well-nourished. No distress.  HENT:  Head: Normocephalic and atraumatic.  Right Ear: Tympanic membrane, external ear and ear canal normal. No foreign bodies.  Left Ear: Tympanic membrane and ear canal normal. No foreign bodies.  Nose: Nose normal.  Mouth/Throat: Oropharynx is clear and moist. No oropharyngeal exudate.       Lateral head turning reproduced dizziness with associated nausea  Eyes: Conjunctivae and EOM are normal. Pupils are equal, round, and reactive to light. Right eye exhibits no discharge. Left eye exhibits no discharge. No scleral icterus.  Neck: Normal range of motion. Neck supple.  Cardiovascular: Regular rhythm and normal heart sounds.  Tachycardia present.  Exam reveals no gallop and no friction rub.   No murmur heard. Pulmonary/Chest: Effort normal and breath sounds normal. No respiratory distress. She has no wheezes.  Lymphadenopathy:    She has no cervical adenopathy.  Neurological: She  is alert. She has normal strength. No cranial nerve deficit. Coordination and gait normal.  Skin: Skin is warm and dry. No rash noted. She is not diaphoretic. No erythema.  Psychiatric: She has a normal mood and affect.          Assessment & Plan:

## 2010-09-21 ENCOUNTER — Telehealth: Payer: Self-pay

## 2010-09-21 NOTE — Telephone Encounter (Signed)
Message copied by Kyung Rudd on Thu Sep 21, 2010 10:05 AM ------      Message from: Letitia Libra, Maisie Fus      Created: Wed Sep 20, 2010  5:39 PM       Cbc, chem7 and tsh nl

## 2010-09-21 NOTE — Telephone Encounter (Signed)
Pt aware.

## 2010-09-24 ENCOUNTER — Encounter: Payer: Self-pay | Admitting: Internal Medicine

## 2010-09-24 DIAGNOSIS — R42 Dizziness and giddiness: Secondary | ICD-10-CM | POA: Insufficient documentation

## 2010-09-24 NOTE — Assessment & Plan Note (Signed)
Obtain chem7 

## 2010-09-24 NOTE — Assessment & Plan Note (Addendum)
Suspect likely  vestibular component. With associated headache consider possible sinus etiology. Begin empiric antibiotics doxycycline x10 days. Obtain CBC, Chem-7 and TSH.Followup if no improvement or worsening.

## 2010-09-25 ENCOUNTER — Telehealth: Payer: Self-pay | Admitting: *Deleted

## 2010-09-25 NOTE — Telephone Encounter (Signed)
Per Dr. Kirtland Bouchard, request was received from patient to transfer to Dr. Rodena Medin.  Dr. Kirtland Bouchard, ok with transfer request.

## 2010-09-26 NOTE — Telephone Encounter (Signed)
ok 

## 2010-09-27 NOTE — Telephone Encounter (Signed)
Pt.notified

## 2010-10-04 ENCOUNTER — Ambulatory Visit: Payer: BC Managed Care – PPO | Admitting: Internal Medicine

## 2010-10-06 ENCOUNTER — Encounter: Payer: Self-pay | Admitting: Internal Medicine

## 2010-10-06 ENCOUNTER — Ambulatory Visit (INDEPENDENT_AMBULATORY_CARE_PROVIDER_SITE_OTHER): Payer: BC Managed Care – PPO | Admitting: Internal Medicine

## 2010-10-06 VITALS — BP 122/82 | Temp 98.2°F | Wt 113.0 lb

## 2010-10-06 DIAGNOSIS — R42 Dizziness and giddiness: Secondary | ICD-10-CM

## 2010-10-06 DIAGNOSIS — Z111 Encounter for screening for respiratory tuberculosis: Secondary | ICD-10-CM

## 2010-10-06 DIAGNOSIS — R209 Unspecified disturbances of skin sensation: Secondary | ICD-10-CM

## 2010-10-06 DIAGNOSIS — R202 Paresthesia of skin: Secondary | ICD-10-CM

## 2010-10-06 DIAGNOSIS — E538 Deficiency of other specified B group vitamins: Secondary | ICD-10-CM

## 2010-10-06 DIAGNOSIS — Z0000 Encounter for general adult medical examination without abnormal findings: Secondary | ICD-10-CM

## 2010-10-06 LAB — VITAMIN B12: Vitamin B-12: 596 pg/mL (ref 211–911)

## 2010-10-06 MED ORDER — ONDANSETRON HCL 4 MG PO TABS: 4.0000 mg | ORAL_TABLET | Freq: Three times a day (TID) | ORAL | Status: DC | PRN

## 2010-10-06 MED ORDER — TUBERCULIN PPD 5 UNIT/0.1ML ID SOLN
5.0000 [IU] | Freq: Once | INTRADERMAL | Status: DC
  Administered 2010-10-06: 5 [IU] via INTRADERMAL

## 2010-10-08 DIAGNOSIS — R202 Paresthesia of skin: Secondary | ICD-10-CM | POA: Insufficient documentation

## 2010-10-08 NOTE — Assessment & Plan Note (Signed)
Dizziness clinic referral. zofran p.r.n. Nausea. Patient avoiding Antivert because of sedation.

## 2010-10-08 NOTE — Assessment & Plan Note (Signed)
Obtain b12 level.  

## 2010-10-08 NOTE — Progress Notes (Signed)
  Subjective:    Patient ID: Jamie Cameron, female    DOB: [birth year], 45 y.o.   MRN: [id number]  HPI Patient presents to clinic for evaluation of dizziness.Status post course of antibiotics with no improvement dizziness. Dizziness tends to occur with position change and according to her history appears to be more consistent with change in head position. No presyncope or syncope. No associated neurologic deficits. Does note intermittent fingertip numbness and tingling. Did have episode of turning over in bed at night with recurrence of dizziness with associated nausea. Notes increased stress level and symptoms tend to increase with increase of stress. Not interested in medication for this. Reviewed normal CBC Chem-7 and TSH. Has been evaluated by her ENT physician  Who told her he did not believe this was related to her ears.  Reviewed past medical history, medications and allergies    Review of Systems see history of present illness    Objective:   Physical Exam  Nursing note and vitals reviewed. Constitutional: She appears well-developed and well-nourished.  HENT:  Head: Normocephalic and atraumatic.  Eyes: Conjunctivae are normal. No scleral icterus.  Neurological: She is alert. No cranial nerve deficit. Coordination normal.  Skin: Skin is warm and dry.  Psychiatric: She has a normal mood and affect.          Assessment & Plan:

## 2010-10-09 LAB — TB SKIN TEST: Induration: 0

## 2010-10-09 NOTE — Progress Notes (Signed)
Pt came in for PPD read. Pt aware.

## 2010-10-10 ENCOUNTER — Telehealth: Payer: Self-pay | Admitting: Internal Medicine

## 2010-10-10 NOTE — Telephone Encounter (Signed)
Saw Dr Rodena Medin the other day and had discuss alternatives for her neuro condition. She would like to try the option to go to [place] to the "dizzy clinic"???   Please  advise.

## 2010-10-10 NOTE — Telephone Encounter (Signed)
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

## 2010-10-10 NOTE — Telephone Encounter (Signed)
Shouldn't be necessary to drive 3 hours for that type of specialist. We'll try and find something similar at [facility] or [facility].

## 2010-10-10 NOTE — Telephone Encounter (Signed)
Patient stated that she is willing to go to the [place] location if ok.

## 2010-10-10 NOTE — Telephone Encounter (Signed)
Pt aware. Pt states that if Dr. Rodena Medin cannot find something closer, then she would still be willing to go to [facility]

## 2010-10-11 ENCOUNTER — Other Ambulatory Visit: Payer: Self-pay | Admitting: Internal Medicine

## 2010-10-11 DIAGNOSIS — R42 Dizziness and giddiness: Secondary | ICD-10-CM

## 2010-10-17 NOTE — Assessment & Plan Note (Signed)
[facility]                         GASTROENTEROLOGY OFFICE NOTE   Jamie Cameron, Jamie Cameron                        MRN:          [id number]  DATE:12/19/2006                            DOB:          01-31-1966    HISTORY OF PRESENT ILLNESS:  Jamie Cameron is a 45 year old white female  referred through he courtesy of Dr. Amador Cunas for evaluation of rectal  bleeding.   For the last 2 months, the patient has had passage of which she  describes as blood clots, spontaneously without real abdominal pain or  diarrhea. She carries a diagnosis of chronic irritable bowel syndrome,  diarrhea predominant, with gas and bloating and has been seen for many  years by Dr. Victorino Dike but has not had colonoscopy or endoscopy or any  radiographic studies. She has a sister that suffers from severe IBS.  She, in the past, has been treated with Aciphex for acid reflux symptoms  and also Pamine for abdominal cramping. 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   The patient exercises heavily every day but does not do long distance  running. She wonders if this is playing a role in her rectal bleeding.  She has not had hemorrhoids and apparently saw Dr. Amador Cunas recently,  who also examined her at the time of rectal bleeding and did not see any  rectal protrusions or fissures. She denies severe abdominal or rectal  pain. Her appetite is good but she has diarrhea with fiber products and  she also says that she cannot eat barley. She denies associated skin  rashes, joint pains, oral stomatitis, visual difficulties, etc. She does  complain of a lot of abdominal gas and bloating.   PAST MEDICAL HISTORY:  Otherwise is entirely noncontributory without  chronic medical problems.   MEDICATIONS:  A variety of multivitamins and vitamin D.   ALLERGIES:  She, in the past, has had reactions to Ciprofloxacin and  Sulfa.   FAMILY HISTORY:  Remarkable for diabetes in her  mother and sister with  IBS.   SOCIAL HISTORY:  She is married and lives with her husband and children.  She is self-employed. She does have a college degree. She does not abuse  ethanol but does smoke.   REVIEW OF SYSTEMS:  Noncontributory.   PHYSICAL EXAMINATION:  GENERAL:  An attractive, healthy appearing, white  female in no distress. Her stated age.  VITAL SIGNS:  She is 5 foot 3. Weighs 114 pounds. Blood pressure 118/72,  pulse 68 and regular.  SKIN:  Could not appreciate stigmata of chronic liver disease.  CHEST:  Clear.  HEART:  Regular rhythm without murmur, rub, or gallop.  ABDOMEN:  Examination showed no organomegaly, masses, tenderness, or  distention. Bowel sounds were normal.  EXTREMITIES:  Peripheral extremities were unremarkable.  RECTAL:  Inspection unremarkable, as was rectal examination. There was a  small amount of stool in the rectal vault that was guaiac negative. I  could not appreciate any peri-rectal lesions or masses.  NEUROLOGIC:  Mental status was clear.   ASSESSMENT:  1. Intermittent, rather severe, lower gastrointestinal bleeding, which  does not sound like hemorrhoidal bleeding. Her symptoms, also would      seem somewhat inconsistent with ischemic colitis. Per the      chronicity of her symptoms, I wonder if she has underlying      inflammatory bowel disease.  2. History of wheat intolerance, rule out celiac disease. The patient      also relates that she, in the past, has been told that she has some      mild osteoporosis,which would go along with this diagnosis.  3. 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  4. History of Ciprofloxacin and Sulfa allergies.   RECOMMENDATIONS:  1. Check screening lab parameters including celiac panel.  2. Outpatient colonoscopy at her convenience.  3. Continue other multiple medications per Dr. Amador Cunas.  4. Consider endoscopy and small bowel endoscopy,  depending on celiac      panel results.     [name]. Jarold Motto, MD, Caleen Essex, FAGA  Electronically Signed    DRP/MedQ  DD: 12/19/2006  DT: 12/19/2006  Job #: [id number]   cc:   Gordy Savers, MD

## 2010-10-17 NOTE — Assessment & Plan Note (Signed)
 [facility]                         GASTROENTEROLOGY OFFICE NOTE   LYNDAL, ALAMILLO                        MRN:          [id number]  DATE:01/10/2007                            DOB:          04-14-1966    Cailyn is asymptomatic at this time. She had a colonoscopy which was  unremarkable on December 25, 2006 except for mixed hemorrhoids. I discussed  hemorrhoids and their management with her today, and suggested that she  possibly get on daily Benefiber, especially when under periods of  stress. She does have irritable bowel syndrome and we prescribe  hyoscyamine 0.125 mg sublingually every 6 to 8 hours on a p.r.n. basis.  We will be glad to see her in the future as needed. She sees Dr.  Amador Cunas for primary care.   ADDENDUM:  Her blood work was all normal including the celiac panel. Her  B12 level was over 1000 and she does take exogenous B12. There was no  evidence of iron deficiency, anemia, her metabolic profile was  additionally normal.     [name]. Jarold Motto, MD, Caleen Essex, FAGA  Electronically Signed    DRP/MedQ  DD: 01/10/2007  DT: 01/10/2007  Job #: [id number]   cc:   Gordy Savers, MD

## 2010-10-17 NOTE — Op Note (Signed)
Jamie Cameron, POL NO.:  [id number]   MEDICAL RECORD NO.:  [id number]          PATIENT TYPE:  EMS   LOCATION:  ED                           FACILITY:  [facility]   PHYSICIAN:  Vanita Panda. Magnus Ivan, M.D.DATE OF BIRTH:  June 15, 1965   DATE OF PROCEDURE:  06/04/2008  DATE OF DISCHARGE:  06/04/2008                               OPERATIVE REPORT   PREPROCEDURE DIAGNOSIS:  Right middle finger crush injury with tuft  fracture and a less 2-cm laceration at the volar surface.   POSTPROCEDURE DIAGNOSIS:  Right middle finger crush injury with tuft  fracture and a less 2-cm laceration at the volar surface.   PROCEDURES:  1. Irrigation of right middle finger tip wound.  2. Primary simple closure of left middle fingertip laceration.   SURGEON:  Doneen Poisson, MD.   ANESTHESIA:  2% plain lidocaine digital block with supplemental 0.25%  plain Sensorcaine digital block.   BLOOD LOSS:  Minimal.   COMPLICATIONS:  None.   INDICATIONS:  Briefly, [name] is a 42-year right-hand-dominant  female who accidentally had her middle and ring fingers on her right  dominant hand closed in a door today.  She came to the [facility]  Emergency Room and x-rays were obtained.  She had tuft fractures of the  distal phalanx of both the middle and ring fingers.  The overall  alignment was acceptable.  On examination of the middle finger she did  have a laceration just distal to the DIP joint of that finger.  The nail  itself was intact and there was a subungual hematoma.  Her fourth finger  showed no laceration but obvious soft tissue swelling and a subungual  hematoma as well.   I recommended cleaning of the wounds under digital block and closure of  the laceration.  The risks and benefits of this were explained to her  and understood.   DESCRIPTION OF PROCEDURE:  I then prepped her hand in its entirety with  Betadine.  I provided a digital block in both the ring and middle  fingers using first 2% plain lidocaine, followed by 0.25% plain  Sensorcaine.  Adequate anesthesia was obtained.  Then I assessed the  alignment of her fingertips and they were overall well maintained.  She  did have difficulty flexing and extending but I think a lot of this was  secondary to just the crushing type of injury and the pain that she was  having with severe anxiety before the procedure.  I then irrigated the  middle finger laceration, which was under 2 cm, on the volar aspect, and  closed this with interrupted 4-0 Prolene suture.  The pulp and volar  aspect of of the ring finger was so swollen I did make a small incision  in this area to relieve pressure.  I closed this with a single 4-0  Prolene suture.  I then cleaned the hand real well again and placed  Xeroform around  both of these incisions, followed by a well-padded sterile dressing.  Her fingers remained perfused throughout the procedure.  She was  discharged from  the ER in stable condition on oral doxycycline and oral  pain medications.  Follow up with me in the office in 3-5 days.      Vanita Panda. Magnus Ivan, M.D.  Electronically Signed     CYB/MEDQ  D:  06/04/2008  T:  06/05/2008  Job:  [id number]

## 2010-10-20 NOTE — H&P (Signed)
[facility]  Patient:    Jamie Cameron, Jamie Cameron Visit Number: [id number] MRN: [id number]          Service Type: Attending:  Nadyne Coombes. Fontaine, M.D. Dictated by:   Nadyne Coombes. Fontaine, M.D. Adm. Date:  04/17/01                           History and Physical  CHIEF COMPLAINT:              Left probable femoral hernia.  HISTORY OF PRESENT ILLNESS:   [age]-year-old G1, P0, female at [redacted] weeks gestation with increased left groin swelling and pain, suspicious for femoral hernia.  This area has progressively worsened throughout her pregnancy.  She has been evaluated by general surgery, with an expectant management plan.  Due to the increasing pain, she is admitted at this time for induction and delivery.  Prenatal course has otherwise been uncomplicated.  She has undergone an amniocentesis due to maternal age, which was normal.  The husband is of middle European Jewish ancestry and has been screened for Tay-Sachs and Canavan disease, both of which are negative.  PAST MEDICAL HISTORY:         Negative.  PAST SURGICAL HISTORY:        1. Eye surgery.                               2. Laparoscopy.  ALLERGIES:                    SULFA DRUGS.  CURRENT MEDICATIONS:          Baby aspirin.  REVIEW OF SYSTEMS:            Noncontributory.  SOCIAL HISTORY:               Noncontributory.  FAMILY HISTORY:               Noncontributory.  PHYSICAL EXAMINATION:  VITAL SIGNS:                  Afebrile, vital signs are stable.  HEENT:                        Normal.  LUNGS:                        Clear.  CARDIAC:                      Regular rate without rubs, murmurs, or gallops.  ABDOMEN:                      Benign, with gravid vertex fetus.  Positive fetal heart tones.  PELVIC:                       Cervix is closed, 40% effaced, -2 station, vertex presentation.  ASSESSMENT:                   [age]-year-old G1, P0 female, 38 weeks, with  increasingly painful probable femoral hernia, for staged induction to include Cervidil followed by Pitocin induction.  The risks and benefits of the proprosed plan were reviewed with the patient and her husband, both of whom agree.Dictated by:   Nadyne Coombes. Fontaine, M.D. Attending:  Nadyne Coombes.  Fontaine, M.D. DD:  04/16/01 TD:  04/16/01 Job: [id number]

## 2010-10-20 NOTE — Discharge Summary (Signed)
[facility]  Patient:    Jamie Cameron, Jamie Cameron Visit Number: [id number] MRN: [id number]          Service Type: OBS Location: 910A 9135 01 Attending Physician:  Tonye Royalty Dictated by:   Antony Contras, N.P. Admit Date:  04/17/2001 Discharge Date: 04/20/2001                             Discharge Summary  DISCHARGE DIAGNOSES:          1. Intrauterine pregnancy at term.                               2. Left femoral hernia pain.  PROCEDURES:                   1. Induction of labor.                               2. Normal spontaneous vaginal delivery of viable                                  infant over midline episiotomy.  HISTORY OF PRESENT ILLNESS:   Patient is a 45 year old primigravida with an LMP of July 26, 2000.  EDC is April 30, 2001.  Prenatal course was complicated by a left femoral hernia, which gave the patient significant pain during the pregnancy.  LABORATORY/ACCESSORY DATA:    Blood type O-positive.  Antibody screen negative.  RPR and HBsAg nonreactive.  Rubella immune.  Amniocentesis was normal.  GBS negative.  HOSPITAL COURSE/TREATMENT:    Patient was admitted on April 18, 2001 and [redacted] weeks gestation for induction of labor secondary to incapacitating left femoral hernia pain.  Cervix on admission was a fingertip, posterior, ballottable.  Labor was initiated with Cervidil, followed by high-dose Pitocin.  She did progress to complete dilatation.  Delivered an Apgar 8/9 female infant.  Birth weight was 6 pounds 3 ounces.  POSTPARTUM COURSE:            Patient remained afebrile.  Was able to be discharged in satisfactory condition on her second postpartum day.  CBC: Hematocrit 31.7, hemoglobin 10.8, WBC 16.1, platelets 134,000.  DISPOSITION:                  Follow up in six weeks.  Continue prenatal vitamins and iron.  Motrin and Tylox for pain. Dictated by:   Antony Contras, N.P. Attending Physician:  Tonye Royalty DD:  05/09/01 TD:  05/09/01 Job: [id number]

## 2010-11-28 ENCOUNTER — Ambulatory Visit (INDEPENDENT_AMBULATORY_CARE_PROVIDER_SITE_OTHER): Payer: BC Managed Care – PPO | Admitting: Internal Medicine

## 2010-11-28 ENCOUNTER — Encounter: Payer: Self-pay | Admitting: Internal Medicine

## 2010-11-28 DIAGNOSIS — Z020 Encounter for examination for admission to educational institution: Secondary | ICD-10-CM

## 2010-11-28 DIAGNOSIS — R42 Dizziness and giddiness: Secondary | ICD-10-CM

## 2010-12-05 DIAGNOSIS — Z020 Encounter for examination for admission to educational institution: Secondary | ICD-10-CM | POA: Insufficient documentation

## 2010-12-05 NOTE — Assessment & Plan Note (Signed)
Normal exam. Form completed. Letter provided recommend testing an isolated separate room due to focus issues.

## 2010-12-05 NOTE — Assessment & Plan Note (Signed)
Proceeding with vestibular clinic evaluation

## 2010-12-05 NOTE — Progress Notes (Signed)
  Subjective:    Patient ID: Jamie Cameron, female    DOB: 10/30/1965, 45 y.o.   MRN: [id number]  HPI Pt presents to clinic for evaluation of dizziness. Continues to have difficulty with dizziness and tinnitus. Scheduled to see [facility] vestibular clinic in the next several days. 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 Has difficulty with focus. Needs school physical form completed. No other complaints.  Reviewed past medical history, medications and allergies.  Review of Systems  Constitutional: Negative for fever and chills.  HENT: Positive for tinnitus. Negative for nosebleeds.   Neurological: Positive for dizziness. Negative for tremors, syncope and weakness.       Objective:   Physical Exam  Nursing note and vitals reviewed. Constitutional: She appears well-developed and well-nourished. No distress.  HENT:  Head: Normocephalic and atraumatic.  Right Ear: External ear normal.  Left Ear: External ear normal.  Nose: Nose normal.  Mouth/Throat: Oropharynx is clear and moist. No oropharyngeal exudate.       Hearing grossly intact bilaterally  Eyes: EOM are normal. Pupils are equal, round, and reactive to light. Right eye exhibits no discharge. Left eye exhibits no discharge. No scleral icterus.  Neck: Neck supple.  Cardiovascular: Normal rate, regular rhythm and normal heart sounds.  Exam reveals no gallop and no friction rub.   No murmur heard. Pulmonary/Chest: Effort normal and breath sounds normal. No respiratory distress. She has no wheezes. She has no rales.  Abdominal: Soft. Bowel sounds are normal. She exhibits no distension and no mass. There is no tenderness. There is no rebound and no guarding.  Lymphadenopathy:    She has no cervical adenopathy.  Neurological: She is alert.  Skin: Skin is warm and dry. No rash noted. She is not diaphoretic. No erythema.  Psychiatric: She has a normal mood and affect.          Assessment & Plan:

## 2010-12-26 ENCOUNTER — Encounter: Payer: BC Managed Care – PPO | Admitting: Gynecology

## 2010-12-27 ENCOUNTER — Telehealth: Payer: Self-pay | Admitting: Internal Medicine

## 2010-12-27 MED ORDER — AMOXICILLIN 875 MG PO TABS: 875.0000 mg | ORAL_TABLET | Freq: Two times a day (BID) | ORAL | Status: AC

## 2010-12-27 NOTE — Telephone Encounter (Signed)
Her husband said the resident ent thought she had an om but the attending didn't give her abx. If that's what she's talking about then amox 875mg  bid x 7d if not allergic and no interactions.

## 2010-12-27 NOTE — Telephone Encounter (Signed)
Patient returned phone call. She was informed per Dr Rodena Medin instructions. She stated that she did not have any allergies to medication. Medication sent to pharmacy.

## 2010-12-27 NOTE — Telephone Encounter (Signed)
Patient states that her right ear has been hurting and very stopped up for about a week now. She has been taking OTC sudafed decongestent for the past four days. That has not helped. She is leaving tomorrow for vacation and cannot come in for appt. Patient states that Dr. Rodena Medin has seen her for this before and would like something called in.

## 2011-01-11 ENCOUNTER — Other Ambulatory Visit (HOSPITAL_COMMUNITY)
Admission: RE | Admit: 2011-01-11 | Discharge: 2011-01-11 | Disposition: A | Discharge status: Home / Self Care | Payer: BC Managed Care – PPO | Source: Ambulatory Visit | Attending: Gynecology | Admitting: Gynecology

## 2011-01-11 ENCOUNTER — Encounter: Payer: Self-pay | Admitting: Gynecology

## 2011-01-11 ENCOUNTER — Ambulatory Visit (INDEPENDENT_AMBULATORY_CARE_PROVIDER_SITE_OTHER): Payer: BC Managed Care – PPO | Admitting: Gynecology

## 2011-01-11 VITALS — BP 112/74 | Ht 63.0 in | Wt 109.0 lb

## 2011-01-11 DIAGNOSIS — R82998 Other abnormal findings in urine: Secondary | ICD-10-CM

## 2011-01-11 DIAGNOSIS — Z01419 Encounter for gynecological examination (general) (routine) without abnormal findings: Secondary | ICD-10-CM

## 2011-01-11 DIAGNOSIS — E876 Hypokalemia: Secondary | ICD-10-CM

## 2011-01-11 DIAGNOSIS — Z13220 Encounter for screening for lipoid disorders: Secondary | ICD-10-CM

## 2011-01-11 DIAGNOSIS — N926 Irregular menstruation, unspecified: Secondary | ICD-10-CM

## 2011-01-11 DIAGNOSIS — Z131 Encounter for screening for diabetes mellitus: Secondary | ICD-10-CM

## 2011-01-11 DIAGNOSIS — N644 Mastodynia: Secondary | ICD-10-CM

## 2011-01-11 NOTE — Progress Notes (Signed)
Jamie Cameron 14-Nov-1965 [id number]        45 y.o.  for annual exam.  Patient notes a left breast discomfort following hitting her breast.  She wasn't sure if her husband felt a small nodule in that area. Had her mammogram in December it is due to repeated this coming December. Also notes her periods are light they range anywhere from every month to every other month. No bleeding in between periods no hot flushes sweats or other symptoms. Using vasectomy for birth control  Past medical history,surgical history, allergies, family history and social history were all reviewed and documented in the EPIC chart. ROS:  Was performed and pertinent positives and negatives are included in the history.  Exam: chaperone present Filed Vitals:   01/11/11 0908  BP: 112/74   General appearance  Normal Skin grossly normal Head/Neck normal with no cervical or supraclavicular adenopathy thyroid normal Lungs  clear Cardiac RR, without RMG Abdominal  soft, nontender, without masses, organomegaly or hernia Breasts  examined lying and sitting without masses, retractions, discharge or axillary adenopathy. Pelvic  Ext/BUS/vagina  normal   Cervix  normal  Pap done  Uterus  anteverted, normal size, shape and contour, midline and mobile nontender   Adnexa  Without masses or tenderness    Anus and perineum  normal   Rectovaginal  normal sphincter tone without palpated masses or tenderness.    Assessment/Plan:  45 y.o. female for annual exam.    #1 Left breast tenderness. Patient reports tenderness at the 3:00 position right off the areola following hitting her breast. There is no palpable abnormalities at all and asked patient to observe this area as long as it resolves we'll follow.  If she feels any masses or the discomfort persists she is to call me for further evaluation. She is due for her mammogram in December will follow up for this. #2 Light menses. Patient notes her menses are light and she'll skip up to  every other month. Not having hot flashes night sweats or other menopausal symptoms. We'll check a baseline TSH prolactin FSH. Assuming normal she'll keep track of her menses as long she withdraws every other month and we'll follow her sugars up to 3 months she knows to call me for progesterone withdrawal. #3 Health maintenance. Self breast exams on a monthly basis discussed urge will follow up for mammogram in December. Vasectomy birth control. She is being followed for hypokalemia Will check a glucose lipid profile basic metabolic panel urinalysis along with her TSH FSH and prolactin. Assuming she continues well then she'll see me in a year sooner as needed    [name] MD, 10:04 AM 01/11/2011

## 2011-01-11 NOTE — Progress Notes (Signed)
Addended byCammie Mcgee T on: 01/11/2011 11:05 AM   Modules accepted: Orders

## 2011-01-12 LAB — BASIC METABOLIC PANEL
BUN: 13 mg/dL (ref 6–23)
CO2: 20 mEq/L (ref 19–32)
Calcium: 10.2 mg/dL (ref 8.4–10.5)
Chloride: 104 mEq/L (ref 96–112)
Creat: 0.75 mg/dL (ref 0.50–1.10)

## 2011-01-14 MED ORDER — FLUCONAZOLE 150 MG PO TABS: 150.0000 mg | ORAL_TABLET | Freq: Once | ORAL | Status: AC

## 2011-01-14 NOTE — Progress Notes (Signed)
Addended by: [name] on: 01/14/2011 01:31 PM   Modules accepted: Orders

## 2011-01-16 ENCOUNTER — Ambulatory Visit (INDEPENDENT_AMBULATORY_CARE_PROVIDER_SITE_OTHER): Payer: BC Managed Care – PPO | Admitting: *Deleted

## 2011-01-16 DIAGNOSIS — Z13220 Encounter for screening for lipoid disorders: Secondary | ICD-10-CM

## 2011-01-18 ENCOUNTER — Other Ambulatory Visit (INDEPENDENT_AMBULATORY_CARE_PROVIDER_SITE_OTHER): Payer: BC Managed Care – PPO | Admitting: *Deleted

## 2011-01-18 DIAGNOSIS — Z13220 Encounter for screening for lipoid disorders: Secondary | ICD-10-CM

## 2011-02-01 ENCOUNTER — Encounter: Payer: Self-pay | Admitting: Gynecology

## 2011-02-01 ENCOUNTER — Ambulatory Visit (INDEPENDENT_AMBULATORY_CARE_PROVIDER_SITE_OTHER): Payer: BC Managed Care – PPO | Admitting: Gynecology

## 2011-02-01 DIAGNOSIS — R209 Unspecified disturbances of skin sensation: Secondary | ICD-10-CM

## 2011-02-01 DIAGNOSIS — R200 Anesthesia of skin: Secondary | ICD-10-CM

## 2011-02-01 NOTE — Progress Notes (Signed)
Patient presents complaining of some bruising questionable redness to her prior blood slight draw and tingling in her fingers. She's had 3 separate blood draws over the last several weeks in followup of her cholesterol. Notes that immediately following the blood draw she was fine but almost 2 weeks later she started noticing some bruising which has now dissipated and some tingling in her hands. There was some redness around the blood draw site but this has resolved.   Exam On inspection of her left arm antecubital area prior site of blood draws noted but there is no evidence of cellulitis, infection or bruising.. Palpation over this area shows no underlying nodularity or hematoma and there is no tenderness. Lower arm exam is normal with pulses good capillary refill good muscle strength. No evidence of bruising on the rest of the arm.  Assessment and plan: Tingling after blood draw some transient bruising. I've recommended the patient place heat over her area of blood draw and monitor at present. If these symptoms persist I would consider referral to neurology but I think that this will resolve over time. I wonder if she does not have a small hematoma underlying skin that may be irritating a nerve. I doubt overt neural injury as she had no symptoms immediately following the blood drawn this was a delayed reaction.

## 2011-02-21 ENCOUNTER — Ambulatory Visit (INDEPENDENT_AMBULATORY_CARE_PROVIDER_SITE_OTHER): Payer: BC Managed Care – PPO | Admitting: Family

## 2011-02-21 DIAGNOSIS — Z0184 Encounter for antibody response examination: Secondary | ICD-10-CM

## 2011-02-21 DIAGNOSIS — Z111 Encounter for screening for respiratory tuberculosis: Secondary | ICD-10-CM

## 2011-02-23 ENCOUNTER — Ambulatory Visit (INDEPENDENT_AMBULATORY_CARE_PROVIDER_SITE_OTHER): Payer: BC Managed Care – PPO | Admitting: Family

## 2011-02-23 DIAGNOSIS — Z111 Encounter for screening for respiratory tuberculosis: Secondary | ICD-10-CM

## 2011-02-23 NOTE — Progress Notes (Signed)
  Subjective:    Patient ID: Jamie Cameron, female    DOB: 10/07/1965, 45 y.o.   MRN: [id number]  HPI  The patient presented after 48 hours to check the injection site for positive or negative reaction.  Review of Systems     Objective:   Physical Exam  No firm bump forms at the test site.  Slightly reddish appearance and diameter was smaller than 5mm.      Assessment & Plan:   Negative TB skin test. Patient was counseled to call if she experiences any irritation of test site.

## 2011-03-29 ENCOUNTER — Ambulatory Visit (INDEPENDENT_AMBULATORY_CARE_PROVIDER_SITE_OTHER): Payer: BC Managed Care – PPO

## 2011-03-29 ENCOUNTER — Encounter: Payer: Self-pay | Admitting: *Deleted

## 2011-03-29 DIAGNOSIS — Z23 Encounter for immunization: Secondary | ICD-10-CM

## 2011-05-02 ENCOUNTER — Encounter: Payer: Self-pay | Admitting: Family

## 2011-05-02 ENCOUNTER — Ambulatory Visit (INDEPENDENT_AMBULATORY_CARE_PROVIDER_SITE_OTHER): Payer: BC Managed Care – PPO | Admitting: Family

## 2011-05-02 ENCOUNTER — Ambulatory Visit: Payer: BC Managed Care – PPO | Admitting: Family

## 2011-05-02 VITALS — BP 118/80 | HR 101 | Temp 98.1°F | Resp 18 | Ht 63.0 in | Wt 120.0 lb

## 2011-05-02 DIAGNOSIS — J029 Acute pharyngitis, unspecified: Secondary | ICD-10-CM

## 2011-05-02 DIAGNOSIS — J069 Acute upper respiratory infection, unspecified: Secondary | ICD-10-CM

## 2011-05-02 MED ORDER — CHLORPHENIRAMINE-HYDROCODONE 8-10 MG/5ML PO LQCR: ORAL | Status: DC

## 2011-05-02 MED ORDER — FLUTICASONE-SALMETEROL 250-50 MCG/DOSE IN AEPB: 1.0000 | INHALATION_SPRAY | RESPIRATORY_TRACT | Status: DC | PRN

## 2011-05-02 NOTE — Progress Notes (Signed)
Subjective:    Patient ID: Jamie Cameron, female    DOB: 01/04/1966, 45 y.o.   MRN: [id number]  HPI  Jamie Cameron is a 45 yr old female who presents today with complaint of sore throat.  + associated chest congestion, aches, chills sweats. Symptoms started 3 days ago.  No known fever. Energy is poor.  + hoarseness/shortness of breath.   Asthma- ran out of her advair.  + wheezing.   OM- treated by Dr. Ezzard Standing last week.  She had topical abx applied in the office. She is to start Ciprodex on Thursday.  Now her left ear is starting to bother her as well.     Review of Systems  Gastrointestinal: Positive for vomiting.  Hematological: Positive for adenopathy.   Past Medical History  Diagnosis Date  . Allergy   . Asthma   . Endometriosis   . Hypokalemia     History   Social History  . Marital Status: Married    Spouse Name: N/A    Number of Children: N/A  . Years of Education: N/A   Occupational History  . Not on file.   Social History Main Topics  . Smoking status: Never Smoker   . Smokeless tobacco: Never Used  . Alcohol Use: No  . Drug Use: No  . Sexually Active: Yes     vasectomyy   Other Topics Concern  . Not on file   Social History Narrative  . No narrative on file    Past Surgical History  Procedure Date  . Foot surgery   . Gyn surgery   . Abdominoplasty   . Chloecystectomy   . Pelvic laparoscopy     endometriosis  . Breast surgery 1999    Left benign lymph node    Family History  Problem Relation Age of Onset  . Hypertension Mother   . Hypertension Father   . Cancer Maternal Grandmother     cervical  . Diabetes Maternal Grandfather   . Breast cancer Maternal Aunt   . Breast cancer Maternal Aunt     Allergies  Allergen Reactions  . Latex   . Prednisone   . Sulfacetamide Sodium     Current Outpatient Prescriptions on File Prior to Visit  Medication Sig Dispense Refill  . Calcium Carbonate-Vitamin D (CALCIUM + D PO) Take by mouth.         . potassium chloride SA (K-DUR,KLOR-CON) 20 MEQ tablet Take 20 mEq by mouth 2 (two) times daily.        . chlorpheniramine-HYDROcodone (TUSSIONEX PENNKINETIC ER) 10-8 MG/5ML LQCR Take 5 mLs by mouth every 12 (twelve) hours as needed.          BP 118/80  Pulse 101  Temp(Src) 98.1 F (36.7 C) (Oral)  Resp 18  Ht 5\' 3"  (1.6 m)  Wt 120 lb (54.432 kg)  BMI 21.26 kg/m2  SpO2 100%       Objective:   Physical Exam  Constitutional: She appears well-developed and well-nourished. No distress.  HENT:  Head: Normocephalic and atraumatic.  Right Ear: Tympanic membrane and ear canal normal.  Left Ear: Tympanic membrane and ear canal normal.  Mouth/Throat: No oropharyngeal exudate, posterior oropharyngeal edema or posterior oropharyngeal erythema.  Eyes: Pupils are equal, round, and reactive to light.  Cardiovascular: Normal rate and regular rhythm.   No murmur heard. Pulmonary/Chest: Effort normal and breath sounds normal. No respiratory distress. She has no wheezes. She has no rales. She exhibits no tenderness.  Lymphadenopathy:  She has no cervical adenopathy.  Psychiatric: She has a normal mood and affect. Her behavior is normal. Judgment and thought content normal.          Assessment & Plan:

## 2011-05-02 NOTE — Assessment & Plan Note (Signed)
Plan supportive measures, tussionex prn cough.  Pt to call if symptoms worsen or if not feeling better in 2-3 days.

## 2011-05-03 ENCOUNTER — Telehealth: Payer: Self-pay | Admitting: Internal Medicine

## 2011-05-03 NOTE — Telephone Encounter (Signed)
advair disku 250/50 mis glax. Inhale 1 puff into the lungs as needed.  Pharmacy comments: verify sig. One puff as needed. This drug is 1 puff bid.

## 2011-05-03 NOTE — Telephone Encounter (Signed)
Please advise 

## 2011-05-04 ENCOUNTER — Ambulatory Visit (INDEPENDENT_AMBULATORY_CARE_PROVIDER_SITE_OTHER): Payer: BC Managed Care – PPO | Admitting: Internal Medicine

## 2011-05-04 ENCOUNTER — Encounter: Payer: Self-pay | Admitting: Internal Medicine

## 2011-05-04 VITALS — BP 100/72 | HR 91 | Temp 98.6°F | Resp 18

## 2011-05-04 DIAGNOSIS — J069 Acute upper respiratory infection, unspecified: Secondary | ICD-10-CM

## 2011-05-04 MED ORDER — FLUTICASONE-SALMETEROL 250-50 MCG/DOSE IN AEPB: 1.0000 | INHALATION_SPRAY | Freq: Two times a day (BID) | RESPIRATORY_TRACT | Status: DC

## 2011-05-04 MED ORDER — AZITHROMYCIN 250 MG PO TABS: ORAL_TABLET | ORAL | Status: DC

## 2011-05-04 MED ORDER — CHLORPHENIRAMINE-HYDROCODONE 8-10 MG/5ML PO LQCR: 5.0000 mL | Freq: Two times a day (BID) | ORAL | Status: AC | PRN

## 2011-05-04 NOTE — Telephone Encounter (Signed)
Gave verbal clarification to the pharmacist.

## 2011-05-04 NOTE — Progress Notes (Signed)
  Subjective:    Patient ID: Jamie Cameron, female    DOB: 1965-08-03, 45 y.o.   MRN: [id number]  HPI Pt presents to clinic for evaluation of cough. Notes persistent cough, chest congestion, ST, HA and fever for ~5 days. Using tussionex prn without sedation or other side effect. No other alleviating or exacerbating factors. +sick exposures. No other complaints.  Past Medical History  Diagnosis Date  . Allergy   . Asthma   . Endometriosis   . Hypokalemia    Past Surgical History  Procedure Date  . Foot surgery   . Gyn surgery   . Abdominoplasty   . Chloecystectomy   . Pelvic laparoscopy     endometriosis  . Breast surgery 1999    Left benign lymph node    reports that she has never smoked. She has never used smokeless tobacco. She reports that she does not drink alcohol or use illicit drugs. family history includes Breast cancer in her maternal aunts; Cancer in her maternal grandmother; Diabetes in her maternal grandfather; and Hypertension in her father and mother. Allergies  Allergen Reactions  . Latex   . Prednisone   . Sulfacetamide Sodium      Review of Systems see hpi    Objective:   Physical Exam  Nursing note and vitals reviewed. Constitutional: She appears well-developed and well-nourished. No distress.  HENT:  Head: Normocephalic and atraumatic.  Right Ear: Tympanic membrane, external ear and ear canal normal.  Left Ear: Tympanic membrane, external ear and ear canal normal.  Nose: Rhinorrhea present.  Mouth/Throat: Uvula is midline, oropharynx is clear and moist and mucous membranes are normal. No oropharyngeal exudate, posterior oropharyngeal edema, posterior oropharyngeal erythema or tonsillar abscesses.  Eyes: Conjunctivae are normal. No scleral icterus.  Neck: Neck supple.  Cardiovascular: Normal rate, regular rhythm and normal heart sounds.   Pulmonary/Chest: Effort normal and breath sounds normal. No respiratory distress. She has no wheezes. She has no  rales.  Lymphadenopathy:    She has cervical adenopathy.  Neurological: She is alert.  Skin: Skin is warm and dry. She is not diaphoretic.  Psychiatric: She has a normal mood and affect.          Assessment & Plan:

## 2011-05-04 NOTE — Telephone Encounter (Signed)
Adviar sig should read one puff twice daily.

## 2011-05-04 NOTE — Assessment & Plan Note (Signed)
Refill tussionex for short term prn use. Given zithromax prescription to hold. Begin if sx's do not improve after total duration of 7-10 days. Followup if no improvement or worsening.

## 2011-05-09 ENCOUNTER — Telehealth: Payer: Self-pay | Admitting: *Deleted

## 2011-05-09 MED ORDER — AZITHROMYCIN 250 MG PO TABS: ORAL_TABLET | ORAL | Status: DC

## 2011-05-09 NOTE — Telephone Encounter (Signed)
Jamie Cameron called and left voice message stating she was seen last week and is not feeling any better. She stated that she is not able to come back in to be seen, however her symptoms have not improved and she  would like to know if Dr Rodena Medin would provide another round of antibiotics for her.  Per Dr Rodena Medin, he stated Jamie Cameron could repeat the medication that was provided to her at the last office visit. A rx for Zpak sent to [facility] on [address].   Call placed to Jamie Cameron at [phone], no answer. A voice message was left informing Jamie Cameron of medication to pharmacy. She was advised to call back with a status update on Friday morning.

## 2011-05-14 ENCOUNTER — Encounter: Payer: Self-pay | Admitting: Internal Medicine

## 2011-05-14 ENCOUNTER — Ambulatory Visit (HOSPITAL_BASED_OUTPATIENT_CLINIC_OR_DEPARTMENT_OTHER)
Admission: RE | Admit: 2011-05-14 | Discharge: 2011-05-14 | Disposition: A | Discharge status: Home / Self Care | Payer: BC Managed Care – PPO | Source: Ambulatory Visit | Attending: Internal Medicine | Admitting: Internal Medicine

## 2011-05-14 ENCOUNTER — Ambulatory Visit (INDEPENDENT_AMBULATORY_CARE_PROVIDER_SITE_OTHER): Payer: BC Managed Care – PPO | Admitting: Internal Medicine

## 2011-05-14 VITALS — BP 100/80 | HR 71 | Temp 98.0°F | Resp 18

## 2011-05-14 DIAGNOSIS — R05 Cough: Secondary | ICD-10-CM

## 2011-05-14 DIAGNOSIS — R509 Fever, unspecified: Secondary | ICD-10-CM

## 2011-05-14 DIAGNOSIS — R0989 Other specified symptoms and signs involving the circulatory and respiratory systems: Secondary | ICD-10-CM

## 2011-05-14 DIAGNOSIS — R059 Cough, unspecified: Secondary | ICD-10-CM

## 2011-05-14 MED ORDER — HYDROCOD POLST-CHLORPHEN POLST 10-8 MG/5ML PO LQCR: 5.0000 mL | Freq: Two times a day (BID) | ORAL | Status: DC | PRN

## 2011-05-14 MED ORDER — LEVOFLOXACIN 500 MG PO TABS: 500.0000 mg | ORAL_TABLET | Freq: Every day | ORAL | Status: DC

## 2011-05-14 NOTE — Progress Notes (Signed)
  Subjective:    Patient ID: Jamie Cameron, female    DOB: 03/02/1966, 45 y.o.   MRN: [id number]  HPI Pt presents to clinic for evaluation of persistent cough. Notes ~12 h/o persistent cough, chest congestion, nasal congestion and lg fevers. Notes intermittent wheezing. No dyspnea. Completed zpak without signficant improvement. Has had significant gi upset/nausea/vomitting with prednisone in the past. No other alleviating or exacerbating factors. Taking tussionex for cough without side effect. No other complaints.  Past Medical History  Diagnosis Date  . Allergy   . Asthma   . Endometriosis   . Hypokalemia    Past Surgical History  Procedure Date  . Foot surgery   . Gyn surgery   . Abdominoplasty   . Chloecystectomy   . Pelvic laparoscopy     endometriosis  . Breast surgery 1999    Left benign lymph node    reports that she has never smoked. She has never used smokeless tobacco. She reports that she does not drink alcohol or use illicit drugs. family history includes Breast cancer in her maternal aunts; Cancer in her maternal grandmother; Diabetes in her maternal grandfather; and Hypertension in her father and mother. Allergies  Allergen Reactions  . Latex   . Prednisone   . Sulfacetamide Sodium        Review of Systems see hpi     Objective:   Physical Exam  Nursing note and vitals reviewed. Constitutional: She appears well-developed and well-nourished. No distress.  HENT:  Head: Normocephalic and atraumatic.  Right Ear: External ear normal.  Left Ear: External ear normal.  Nose: Nose normal.  Mouth/Throat: Oropharynx is clear and moist. No oropharyngeal exudate.  Eyes: Conjunctivae are normal.  Cardiovascular: Normal rate, regular rhythm and normal heart sounds.   Pulmonary/Chest: Effort normal and breath sounds normal. No respiratory distress. She has no wheezes. She has no rales.  Neurological: She is alert.  Skin: Skin is warm and dry. She is not diaphoretic.    Psychiatric: She has a normal mood and affect.          Assessment & Plan:

## 2011-05-14 NOTE — Assessment & Plan Note (Signed)
Persistent sx's despite abx course. Obtain cxr pa/lat. Attempt levaquin course. Refill tussionex. Followup if no improvement or worsening.

## 2011-05-22 ENCOUNTER — Telehealth: Payer: Self-pay | Admitting: Internal Medicine

## 2011-05-22 MED ORDER — LEVOFLOXACIN 500 MG PO TABS: 500.0000 mg | ORAL_TABLET | Freq: Every day | ORAL | Status: AC

## 2011-05-22 NOTE — Telephone Encounter (Signed)
Add five days pls

## 2011-05-22 NOTE — Telephone Encounter (Signed)
Patient called stating she is still congested and coughing , was told if not improved to call back for refill on medication ( Levaquin)

## 2011-05-22 NOTE — Telephone Encounter (Signed)
Call returned to patient at [phone], no answer. A detailed voice message was left informing patient of additional medication to pharmacy.

## 2011-11-29 ENCOUNTER — Ambulatory Visit: Payer: BC Managed Care – PPO | Admitting: Gynecology

## 2011-12-13 ENCOUNTER — Ambulatory Visit: Payer: BC Managed Care – PPO | Admitting: Gynecology

## 2011-12-13 ENCOUNTER — Telehealth: Payer: Self-pay | Admitting: Internal Medicine

## 2011-12-13 MED ORDER — LEVOFLOXACIN 500 MG PO TABS: 500.0000 mg | ORAL_TABLET | Freq: Every day | ORAL | Status: AC

## 2011-12-13 MED ORDER — HYDROCOD POLST-CHLORPHEN POLST 10-8 MG/5ML PO LQCR: 5.0000 mL | Freq: Two times a day (BID) | ORAL | Status: DC | PRN

## 2011-12-13 NOTE — Telephone Encounter (Signed)
Please advise 

## 2011-12-13 NOTE — Telephone Encounter (Signed)
Pt notified and Rxs called to [facility] voicemail. Pt was advised that follow up would be needed if symptoms persist after these measures. Pt voices understanding.

## 2011-12-13 NOTE — Telephone Encounter (Signed)
Have given her levaquin 500mg  po qd x7d (last time) and tussionex. Can take the levaquin as long as hasn't taken zithromax for the last at least 2 days.

## 2011-12-13 NOTE — Telephone Encounter (Signed)
Patient states that she went to an Urgent Care on 12/03/11 for cough and congestion. The physician there gave her a z-pack and tussonex(sp?-for coughing). She has finished the z-pack but states that she is not feeling any better. Still coughing and congested. Patient states that she has seen Dr. Rodena Medin before for this and he had given her Keflex and another cough medicine, which worked really good.   She would like to know if Dr. Rodena Medin would call her in some medicine or does she need to be seen?   [facility] on [address]

## 2012-01-15 ENCOUNTER — Other Ambulatory Visit (HOSPITAL_COMMUNITY)
Admission: RE | Admit: 2012-01-15 | Discharge: 2012-01-15 | Disposition: A | Discharge status: Home / Self Care | Payer: BC Managed Care – PPO | Source: Ambulatory Visit | Attending: Gynecology | Admitting: Gynecology

## 2012-01-15 ENCOUNTER — Encounter: Payer: Self-pay | Admitting: Gynecology

## 2012-01-15 ENCOUNTER — Ambulatory Visit (INDEPENDENT_AMBULATORY_CARE_PROVIDER_SITE_OTHER): Payer: BC Managed Care – PPO | Admitting: Gynecology

## 2012-01-15 VITALS — BP 108/72 | Ht 61.75 in | Wt 116.0 lb

## 2012-01-15 DIAGNOSIS — K602 Anal fissure, unspecified: Secondary | ICD-10-CM

## 2012-01-15 DIAGNOSIS — N949 Unspecified condition associated with female genital organs and menstrual cycle: Secondary | ICD-10-CM

## 2012-01-15 DIAGNOSIS — N951 Menopausal and female climacteric states: Secondary | ICD-10-CM

## 2012-01-15 DIAGNOSIS — Z01419 Encounter for gynecological examination (general) (routine) without abnormal findings: Secondary | ICD-10-CM | POA: Insufficient documentation

## 2012-01-15 DIAGNOSIS — Z131 Encounter for screening for diabetes mellitus: Secondary | ICD-10-CM

## 2012-01-15 DIAGNOSIS — Z13220 Encounter for screening for lipoid disorders: Secondary | ICD-10-CM

## 2012-01-15 DIAGNOSIS — N9489 Other specified conditions associated with female genital organs and menstrual cycle: Secondary | ICD-10-CM

## 2012-01-15 DIAGNOSIS — Z1151 Encounter for screening for human papillomavirus (HPV): Secondary | ICD-10-CM | POA: Insufficient documentation

## 2012-01-15 DIAGNOSIS — N926 Irregular menstruation, unspecified: Secondary | ICD-10-CM

## 2012-01-15 LAB — GLUCOSE, RANDOM: Glucose, Bld: 75 mg/dL (ref 70–99)

## 2012-01-15 LAB — WET PREP FOR TRICH, YEAST, CLUE: Trich, Wet Prep: NONE SEEN

## 2012-01-15 LAB — CBC WITH DIFFERENTIAL/PLATELET
Eosinophils Absolute: 0.1 10*3/uL (ref 0.0–0.7)
Eosinophils Relative: 1 % (ref 0–5)
Lymphs Abs: 2 10*3/uL (ref 0.7–4.0)
MCH: 30 pg (ref 26.0–34.0)
MCV: 91.5 fL (ref 78.0–100.0)
Monocytes Relative: 5 % (ref 3–12)
Platelets: 362 10*3/uL (ref 150–400)
RBC: 4.57 MIL/uL (ref 3.87–5.11)

## 2012-01-15 LAB — LIPID PANEL
Cholesterol: 203 mg/dL — ABNORMAL HIGH (ref 0–200)
LDL Cholesterol: 123 mg/dL — ABNORMAL HIGH (ref 0–99)
Triglycerides: 152 mg/dL — ABNORMAL HIGH (ref <150)

## 2012-01-15 LAB — TSH: TSH: 1.333 u[IU]/mL (ref 0.350–4.500)

## 2012-01-15 LAB — FOLLICLE STIMULATING HORMONE: FSH: 7 m[IU]/mL

## 2012-01-15 MED ORDER — LIDOCAINE-HYDROCORTISONE ACE 3-0.5 % RE KIT: PACK | RECTAL | Status: DC

## 2012-01-15 MED ORDER — ALPRAZOLAM 0.25 MG PO TABS: 0.2500 mg | ORAL_TABLET | Freq: Every evening | ORAL | Status: AC | PRN

## 2012-01-15 MED ORDER — FLUCONAZOLE 150 MG PO TABS: 150.0000 mg | ORAL_TABLET | Freq: Once | ORAL | Status: AC

## 2012-01-15 NOTE — Progress Notes (Signed)
Jamie Cameron Oct 16, 1965 [id number]        46 y.o.  G1P1 for annual exam.  Several issues noted below  Past medical history,surgical history, medications, allergies, family history and social history were all reviewed and documented in the EPIC chart. ROS:  Was performed and pertinent positives and negatives are included in the history.  Exam: Sherrilyn Rist assistant Filed Vitals:   01/15/12 0934  BP: 108/72  Height: 5' 1.75" (1.568 m)  Weight: 116 lb (52.617 kg)   General appearance  Normal Skin grossly normal Head/Neck normal with no cervical or supraclavicular adenopathy thyroid normal Lungs  clear Cardiac RR, without RMG Abdominal  soft, nontender, without masses, organomegaly or hernia Breasts  examined lying and sitting without masses, retractions, discharge or axillary adenopathy. Pelvic  Ext/BUS/vagina  normal slight white discharge  Cervix  normal Pap/HPV  Uterus  anteverted, normal size, shape and contour, midline and mobile nontender   Adnexa  Without masses or tenderness    Anus and perineum  normal   Rectovaginal  normal sphincter tone without palpated masses or tenderness. Small fissure at 12:00   Assessment/Plan:  46 y.o. G1P1 female for annual exam, vasectomy birth control.   1. Light menses. Occur every month to every other month.  Also note some mild intermittent hot flashes/night sweats. We'll check baseline TSH prolactin FSH. As long as normal we'll continue to monitor. Patient knows if she goes longer than every other month to call that we do need a more frequent withdrawal. 2. Rectal fissure. Patient notes painful bowel movements with some bleeding. Exam shows a small fissure. She's had these before. [facility] prescribed follow up if symptoms persist. 3. Vaginal irritation. Patient recently treated with oral antibiotics. Wet prep is unremarkable but I think clinically certainly go along with yeast. We'll treat with Diflucan 150x1 dose. Follow up if symptoms persist  or recur. 4. Pap smear. Last Pap smear 2011. Pap/HPV done today. No history of abnormal Pap smears. If normal then plan every five-year screening per current screening guidelines. 5. Mammogram.  Patient knows to schedule as she is overdue. SBE monthly reviewed. 6. Anxiety. 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 Options for management reviewed she wants to try intermittent Xanax. Xanax 0.25 #30 refill x1 prescribed. If becomes more of an issue I discussed daily anxiolytic therapy such as fluoxetine.  Will check TSH as per above. 7. Health maintenance. Baseline CBC lipid profile glucose urinalysis ordered along with above lab work. Follow up in one year, sooner as needed    [name] MD, 10:20 AM 01/15/2012

## 2012-01-15 NOTE — Patient Instructions (Signed)
Take Diflucan one pill. Call if irritation continues. Use [facility] rectal cream as needed. Arrange mammography. Try Xanax as we discussed.  If anxiety worsens, call. Follow up in one year for annual gynecologic exam.

## 2012-01-16 ENCOUNTER — Telehealth: Payer: Self-pay | Admitting: *Deleted

## 2012-01-16 LAB — URINALYSIS W MICROSCOPIC + REFLEX CULTURE
Bacteria, UA: NONE SEEN
Hgb urine dipstick: NEGATIVE
Ketones, ur: NEGATIVE mg/dL
Protein, ur: NEGATIVE mg/dL
Urobilinogen, UA: 0.2 mg/dL (ref 0.0–1.0)

## 2012-01-16 NOTE — Telephone Encounter (Signed)
Pt called saying pharmacy never got xanax 0.25 mg & Lidocaine-Hydrocortisone Ace 3-0.5 % KIT both rx called in.

## 2012-02-19 ENCOUNTER — Ambulatory Visit (INDEPENDENT_AMBULATORY_CARE_PROVIDER_SITE_OTHER): Payer: BC Managed Care – PPO | Admitting: Family

## 2012-02-19 DIAGNOSIS — Z23 Encounter for immunization: Secondary | ICD-10-CM

## 2012-02-19 DIAGNOSIS — Z111 Encounter for screening for respiratory tuberculosis: Secondary | ICD-10-CM

## 2012-02-22 ENCOUNTER — Ambulatory Visit (INDEPENDENT_AMBULATORY_CARE_PROVIDER_SITE_OTHER): Payer: BC Managed Care – PPO | Admitting: Family

## 2012-02-22 DIAGNOSIS — Z111 Encounter for screening for respiratory tuberculosis: Secondary | ICD-10-CM

## 2012-02-22 LAB — TB SKIN TEST: TB Skin Test: NEGATIVE

## 2012-02-22 NOTE — Progress Notes (Signed)
  Subjective:    Patient ID: Jamie Cameron, female    DOB: [birth year], 46 y.o.   MRN: [id number]  HPI Pt returned to the clinic for assessment of PPD skin test placed on 02/19/12.   Review of Systems     Objective:   Physical Exam  No firm bumps forms noted. Slightly reddish appearance that was less than 5mm.      Assessment & Plan:   Negative PPD skin test. Pt was advised to call the office if she experiences any irritation of test site.

## 2012-02-27 ENCOUNTER — Encounter: Payer: Self-pay | Admitting: Gynecology

## 2013-01-20 ENCOUNTER — Encounter (HOSPITAL_BASED_OUTPATIENT_CLINIC_OR_DEPARTMENT_OTHER): Payer: Self-pay | Admitting: *Deleted

## 2013-01-20 ENCOUNTER — Emergency Department (HOSPITAL_BASED_OUTPATIENT_CLINIC_OR_DEPARTMENT_OTHER)
Admission: EM | Admit: 2013-01-20 | Discharge: 2013-01-20 | Disposition: A | Discharge status: Home / Self Care | Payer: BC Managed Care – PPO | Attending: Emergency Medicine | Admitting: Emergency Medicine

## 2013-01-20 DIAGNOSIS — S0181XA Laceration without foreign body of other part of head, initial encounter: Secondary | ICD-10-CM

## 2013-01-20 DIAGNOSIS — Y929 Unspecified place or not applicable: Secondary | ICD-10-CM | POA: Insufficient documentation

## 2013-01-20 DIAGNOSIS — Y939 Activity, unspecified: Secondary | ICD-10-CM | POA: Insufficient documentation

## 2013-01-20 DIAGNOSIS — E876 Hypokalemia: Secondary | ICD-10-CM | POA: Insufficient documentation

## 2013-01-20 DIAGNOSIS — J45909 Unspecified asthma, uncomplicated: Secondary | ICD-10-CM | POA: Insufficient documentation

## 2013-01-20 DIAGNOSIS — S0180XA Unspecified open wound of other part of head, initial encounter: Secondary | ICD-10-CM | POA: Insufficient documentation

## 2013-01-20 DIAGNOSIS — IMO0002 Reserved for concepts with insufficient information to code with codable children: Secondary | ICD-10-CM | POA: Insufficient documentation

## 2013-01-20 DIAGNOSIS — W1809XA Striking against other object with subsequent fall, initial encounter: Secondary | ICD-10-CM | POA: Insufficient documentation

## 2013-01-20 DIAGNOSIS — Z8742 Personal history of other diseases of the female genital tract: Secondary | ICD-10-CM | POA: Insufficient documentation

## 2013-01-20 DIAGNOSIS — Z79899 Other long term (current) drug therapy: Secondary | ICD-10-CM | POA: Insufficient documentation

## 2013-01-20 DIAGNOSIS — Z91040 Latex allergy status: Secondary | ICD-10-CM | POA: Insufficient documentation

## 2013-01-20 NOTE — ED Notes (Signed)
Pt c/o laceration to chin x 1 hr ago from fall hitting chin on tile floor

## 2013-01-21 NOTE — ED Provider Notes (Signed)
CSN: [id number]     Arrival date & time 01/20/13  2136 History     First MD Initiated Contact with Patient 01/20/13 2209     Chief Complaint  Patient presents with  . Laceration   (Consider location/radiation/quality/duration/timing/severity/associated sxs/prior Treatment) Patient is a 47 y.o. female presenting with skin laceration. The history is provided by the patient.  Laceration Location:  Face Facial laceration location:  Chin Length (cm):  2 Depth:  Cutaneous Quality: straight   Bleeding: controlled   Time since incident:  1 hour Laceration mechanism:  Fall Pain details:    Quality:  Aching   Severity:  Mild   Timing:  Constant   Progression:  Unchanged Foreign body present:  No foreign bodies Worsened by:  Nothing tried Ineffective treatments:  None tried Tetanus status:  Up to date   Past Medical History  Diagnosis Date  . Allergy   . Asthma   . Endometriosis   . Hypokalemia    Past Surgical History  Procedure Laterality Date  . Foot surgery    . Gyn surgery    . Abdominoplasty    . Chloecystectomy    . Pelvic laparoscopy  1996    endometriosis  . Breast surgery  1999    Left benign lymph node   Family History  Problem Relation Age of Onset  . Hypertension Mother   . Hypertension Father   . Cancer Maternal Grandmother     cervical  . Diabetes Maternal Grandfather   . Breast cancer Maternal Aunt   . Breast cancer Maternal Aunt    History  Substance Use Topics  . Smoking status: Never Smoker   . Smokeless tobacco: Never Used  . Alcohol Use: No   OB History   Grav Para Term Preterm Abortions TAB SAB Ect Mult Living   1 1        1      Review of Systems  HENT: Negative for neck pain and neck stiffness.   Gastrointestinal: Negative for vomiting.       Chronic nausea  Neurological: Positive for headaches. Negative for syncope, speech difficulty, weakness and numbness.       Chronic stress/tension headaches lately since being in school.   Aleve will usually help  All other systems reviewed and are negative.    Allergies  Latex; Prednisone; and Sulfacetamide sodium  Home Medications   Current Outpatient Rx  Name  Route  Sig  Dispense  Refill  . Calcium Carbonate-Vitamin D (CALCIUM + D PO)   Oral   Take by mouth.           . Fluticasone-Salmeterol (ADVAIR) 250-50 MCG/DOSE AEPB   Inhalation   Inhale 1 puff into the lungs 2 (two) times daily.   60 each   2   . Lidocaine-Hydrocortisone Ace 3-0.5 % KIT      Apply rectally 3 - 4 times daily   1 each   1   . potassium chloride SA (K-DUR,KLOR-CON) 20 MEQ tablet   Oral   Take 20 mEq by mouth 2 (two) times daily.            BP 123/83  Pulse 91  Temp(Src) 97.6 F (36.4 C) (Oral)  Resp 16  Ht 5\' 3"  (1.6 m)  Wt 105 lb (47.628 kg)  BMI 18.6 kg/m2  SpO2 100%  LMP 01/06/2013 Physical Exam  Nursing note and vitals reviewed. Constitutional: She is oriented to person, place, and time. She appears well-developed and well-nourished. No  distress.  HENT:  Head: Normocephalic. Head is with laceration.    Mouth/Throat: Oropharynx is clear and moist.  Eyes: Conjunctivae and EOM are normal. Pupils are equal, round, and reactive to light.  Neck: Normal range of motion. Neck supple. No spinous process tenderness and no muscular tenderness present.  Cardiovascular: Normal rate, regular rhythm and intact distal pulses.   No murmur heard. Pulmonary/Chest: Effort normal and breath sounds normal. No respiratory distress. She has no wheezes. She has no rales.  Abdominal: Soft. She exhibits no distension. There is no tenderness. There is no rebound and no guarding.  Musculoskeletal: Normal range of motion. She exhibits no edema and no tenderness.  Neurological: She is alert and oriented to person, place, and time.  Skin: Skin is warm and dry. No rash noted. No erythema.  Psychiatric: She has a normal mood and affect. Her behavior is normal.    ED Course   Procedures  (including critical care time)  Labs Reviewed - No data to display No results found. 1. Chin laceration, initial encounter    LACERATION REPAIR Performed by: Gwyneth Sprout Authorized byGwyneth Sprout Consent: Verbal consent obtained. Risks and benefits: risks, benefits and alternatives were discussed Consent given by: patient Patient identity confirmed: provided demographic data Prepped and Draped in normal sterile fashion Wound explored  Laceration Location: Chin  Laceration Length: 2 cm  No Foreign Bodies seen or palpated  Anesthesia: local infiltration  Local anesthetic: lidocaine 1 % with epinephrine  Anesthetic total: 2 ml  Irrigation method: syringe Amount of cleaning: standard  Skin closure: 4.0 Prolene   Number of sutures: 3   Technique: Simple interrupted   Patient tolerance: Patient tolerated the procedure well with no immediate complications.  MDM   Patient with a mechanical fall over her dog hitting her chin on the floor causing a laceration. She denies LOC, head injury and denies any neck pain. She is awake and alert with no focal deficits. Exam shows a laceration to her chin but otherwise within normal limits. Tetanus shot is up-to-date and wound was repaired as above  Gwyneth Sprout, MD 01/21/13 0006

## 2013-02-27 ENCOUNTER — Ambulatory Visit (INDEPENDENT_AMBULATORY_CARE_PROVIDER_SITE_OTHER): Payer: BC Managed Care – PPO | Admitting: Gynecology

## 2013-02-27 ENCOUNTER — Encounter: Payer: Self-pay | Admitting: Gynecology

## 2013-02-27 VITALS — BP 120/74 | Ht 62.0 in | Wt 118.0 lb

## 2013-02-27 DIAGNOSIS — R102 Pelvic and perineal pain: Secondary | ICD-10-CM

## 2013-02-27 DIAGNOSIS — Z13220 Encounter for screening for lipoid disorders: Secondary | ICD-10-CM

## 2013-02-27 DIAGNOSIS — Z01419 Encounter for gynecological examination (general) (routine) without abnormal findings: Secondary | ICD-10-CM

## 2013-02-27 DIAGNOSIS — N949 Unspecified condition associated with female genital organs and menstrual cycle: Secondary | ICD-10-CM

## 2013-02-27 LAB — COMPREHENSIVE METABOLIC PANEL
ALT: 17 U/L (ref 0–35)
Albumin: 4.6 g/dL (ref 3.5–5.2)
CO2: 28 mEq/L (ref 19–32)
Calcium: 9.5 mg/dL (ref 8.4–10.5)
Chloride: 103 mEq/L (ref 96–112)
Potassium: 3.7 mEq/L (ref 3.5–5.3)
Sodium: 139 mEq/L (ref 135–145)
Total Protein: 7 g/dL (ref 6.0–8.3)

## 2013-02-27 LAB — LIPID PANEL
Cholesterol: 198 mg/dL (ref 0–200)
VLDL: 35 mg/dL (ref 0–40)

## 2013-02-27 NOTE — Patient Instructions (Signed)
Follow up for ultrasound as scheduled 

## 2013-02-27 NOTE — Progress Notes (Signed)
Jamie Cameron November 04, 1965 [id number]        47 y.o.  G1P1 for annual exam.  Several issues that are below.  Past medical history,surgical history, medications, allergies, family history and social history were all reviewed and documented in the EPIC chart.  ROS:  Performed and pertinent positives and negatives are included in the history, assessment and plan .  Exam: Jamie Cameron assistant Filed Vitals:   02/27/13 0956  BP: 120/74  Height: 5\' 2"  (1.575 m)  Weight: 118 lb (53.524 kg)   General appearance  Normal Skin grossly normal Head/Neck normal with no cervical or supraclavicular adenopathy thyroid normal Lungs  clear Cardiac RR, without RMG Abdominal  soft, nontender, without masses, organomegaly or hernia Breasts  examined lying and sitting without masses, retractions, discharge or axillary adenopathy. Pelvic  Ext/BUS/vagina  normal  Cervix  normal  Uterus  anteverted, normal size, shape and contour, midline and mobile nontender   Adnexa  Without masses or tenderness    Anus and perineum  normal   Rectovaginal  normal sphincter tone without palpated masses or tenderness.    Assessment/Plan:  47 y.o. G1P1 female for annual exam, light menses, vasectomy birth control..   1. Bilateral pelvic pain with some low back radiation. Patient relates reminiscent of her endometriosis previously. Exam is normal. No diarrhea constipation urinary symptoms. We'll start with ultrasound. Discussed possible treatment options to include suppression with BCPs, Mirena IUD, Depo-Lupron or laparoscopy. Patient states she does not do well with BCPs. Apparently had an embedded IUD that required surgery to remove. She is leaning towards laparoscopy and we'll rediscuss after the ultrasound. 2. Light menses. Patient's menses have been light over the past years and she'll skip her menses every once in a while. Lab work last year to include TSH FSH prolactin were normal. Her pattern has not changed and we'll continue  to observe. 3. Mammography scheduled and she will followup for this. SBE monthly reviewed. 4. Pap smear/HPV 01/2012 normal. No Pap smear done today. Plan repeat at 3-5 year interval. No history of dysplasia before. 5. Health maintenance. Baseline CBC comprehensive metabolic panel lipid profile urinalysis ordered. Follow up for ultrasound.  Note: This document was prepared with digital dictation and possible smart phrase technology. Any transcriptional errors that result from this process are unintentional.   Jamie Lords MD, 10:50 AM 02/27/2013

## 2013-02-28 LAB — URINALYSIS W MICROSCOPIC + REFLEX CULTURE
Crystals: NONE SEEN
Glucose, UA: NEGATIVE mg/dL
Specific Gravity, Urine: >1.03 — ABNORMAL HIGH (ref 1.005–1.030)
Squamous Epithelial / LPF: NONE SEEN
Urobilinogen, UA: 0.2 mg/dL (ref 0.0–1.0)

## 2013-02-28 LAB — CBC WITH DIFFERENTIAL/PLATELET
Hemoglobin: 13.9 g/dL (ref 12.0–15.0)
Lymphocytes Relative: 23 % (ref 12–46)
Lymphs Abs: 2.3 10*3/uL (ref 0.7–4.0)
MCV: 90.5 fL (ref 78.0–100.0)
Neutrophils Relative %: 67 % (ref 43–77)
Platelets: 357 10*3/uL (ref 150–400)
RBC: 4.52 MIL/uL (ref 3.87–5.11)
WBC: 9.9 10*3/uL (ref 4.0–10.5)

## 2013-03-11 ENCOUNTER — Ambulatory Visit (INDEPENDENT_AMBULATORY_CARE_PROVIDER_SITE_OTHER): Payer: BC Managed Care – PPO

## 2013-03-11 ENCOUNTER — Ambulatory Visit (INDEPENDENT_AMBULATORY_CARE_PROVIDER_SITE_OTHER): Payer: BC Managed Care – PPO | Admitting: Gynecology

## 2013-03-11 ENCOUNTER — Telehealth: Payer: Self-pay | Admitting: Internal Medicine

## 2013-03-11 ENCOUNTER — Encounter: Payer: Self-pay | Admitting: Gynecology

## 2013-03-11 DIAGNOSIS — R102 Pelvic and perineal pain: Secondary | ICD-10-CM

## 2013-03-11 DIAGNOSIS — N831 Corpus luteum cyst of ovary, unspecified side: Secondary | ICD-10-CM

## 2013-03-11 DIAGNOSIS — R519 Headache, unspecified: Secondary | ICD-10-CM

## 2013-03-11 DIAGNOSIS — IMO0002 Reserved for concepts with insufficient information to code with codable children: Secondary | ICD-10-CM

## 2013-03-11 DIAGNOSIS — N949 Unspecified condition associated with female genital organs and menstrual cycle: Secondary | ICD-10-CM

## 2013-03-11 DIAGNOSIS — R51 Headache: Secondary | ICD-10-CM

## 2013-03-11 DIAGNOSIS — N8300 Follicular cyst of ovary, unspecified side: Secondary | ICD-10-CM

## 2013-03-11 MED ORDER — HYDROCODONE-ACETAMINOPHEN 5-325 MG PO TABS: 1.0000 | ORAL_TABLET | Freq: Four times a day (QID) | ORAL | Status: DC | PRN

## 2013-03-11 NOTE — Patient Instructions (Signed)
Office will call you to arrange appointment with neurologist. Call back to arrange surgery.

## 2013-03-11 NOTE — Progress Notes (Signed)
Patient presents for ultrasound history of worsening pelvic pain reminiscent of her endometriosis.  Ultrasound shows uterus normal size. Endometrium 4.0 mm. Left ovary with small follicle. Right ovary with thick walled cyst internal echoes 15 x 15 mm. Questionable endometrioma versus hemorrhagic cyst. Cul-de-sac negative  Assessment and plan: Worsening pelvic pain reminiscent of her endometriosis. Status post laparoscopy 1996. Options for management again reviewed to include hormonal manipulation Mirena IUD surgery. Patient must proceed with laparoscopy. Will call back to schedule. Also having worsening headaches. Recommended neurology appointment and we'll set up an appointment for her. Hydrocodone 5.0/325 #20 prescribed for pain. Will obtain copy of the operative report from prior laparoscopy.

## 2013-03-11 NOTE — Telephone Encounter (Signed)
Pt states that her husband, Kayanna Mckillop is a pt of Dr. Caryl Never, and that she would also like to become a patient of Dr. Caryl Never. She states that when her husband seen Dr. Caryl Never in the office, he agreed to see her as well. She was a patient of Dr. Rodena Medin. May I schedule her as a new patient with you?

## 2013-03-11 NOTE — Telephone Encounter (Signed)
yes

## 2013-03-12 ENCOUNTER — Telehealth: Payer: Self-pay | Admitting: *Deleted

## 2013-03-12 NOTE — Telephone Encounter (Signed)
Scheduled

## 2013-03-12 NOTE — Telephone Encounter (Signed)
appt on 04/27/13 @ 10:15 am pt will be placed on cancellation list as well. I left message for pt to call.

## 2013-03-12 NOTE — Telephone Encounter (Signed)
Message copied by Aura Camps on Thu Mar 12, 2013  8:32 AM ------      Message from: [name]      Created: Wed Mar 11, 2013 10:02 AM       Schedule an appointment with Dr. Amelia Jo reference worsening headaches ------

## 2013-03-13 ENCOUNTER — Encounter: Payer: Self-pay | Admitting: Gynecology

## 2013-03-17 ENCOUNTER — Ambulatory Visit: Payer: Self-pay | Admitting: Podiatry

## 2013-03-17 ENCOUNTER — Ambulatory Visit: Payer: BC Managed Care – PPO

## 2013-03-18 ENCOUNTER — Encounter: Payer: Self-pay | Admitting: Family

## 2013-03-18 ENCOUNTER — Ambulatory Visit (INDEPENDENT_AMBULATORY_CARE_PROVIDER_SITE_OTHER): Payer: BC Managed Care – PPO | Admitting: Family

## 2013-03-18 VITALS — BP 116/82 | HR 82 | Temp 98.7°F | Resp 16 | Ht 63.0 in | Wt 119.1 lb

## 2013-03-18 DIAGNOSIS — Z8719 Personal history of other diseases of the digestive system: Secondary | ICD-10-CM

## 2013-03-18 DIAGNOSIS — N809 Endometriosis, unspecified: Secondary | ICD-10-CM

## 2013-03-18 DIAGNOSIS — J45909 Unspecified asthma, uncomplicated: Secondary | ICD-10-CM

## 2013-03-18 DIAGNOSIS — Z111 Encounter for screening for respiratory tuberculosis: Secondary | ICD-10-CM

## 2013-03-18 DIAGNOSIS — Z23 Encounter for immunization: Secondary | ICD-10-CM

## 2013-03-18 NOTE — Progress Notes (Signed)
Subjective:    Patient ID: Jamie Cameron, female    DOB: 06/22/1965, 47 y.o.   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  Asthma- the pt is maintained on singulair, proair prn and symbicort. Reports that symbicort has been working better than the advair.   Endometriosis- follows with Dr. Colin Broach.  She is scheduled for a laparoscopy. She continues to have some pain from this.    IBS- Chronic problem. Gets stomach cramps and diarrhea. She is followed by Dr. Loreta Ave for this.    Allergic rhinitis-currently maintained on nasonex and and singulair.  Sees [facility] [place].   Has had pneumonia on two occasions.    Migraine- she will be seeing neurology in November.    Review of Systems See HPI  Past Medical History  Diagnosis Date  . Allergy   . Asthma   . Endometriosis   . Hypokalemia     History   Social History  . Marital Status: Married    Spouse Name: N/A    Number of Children: N/A  . Years of Education: N/A   Occupational History  . Not on file.   Social History Main Topics  . Smoking status: Never Smoker   . Smokeless tobacco: Never Used  . Alcohol Use: No  . Drug Use: No  . Sexual Activity: Yes    Birth Control/ Protection: Other-see comments     Comment: vasectomy   Other Topics Concern  . Not on file   Social History Narrative  . No narrative on file    Past Surgical History  Procedure Laterality Date  . Foot surgery    . Gyn surgery    . Chloecystectomy    . Pelvic laparoscopy  1996    endometriosis  . Breast surgery  1999    Left benign lymph node    Family History  Problem Relation Age of Onset  . Hypertension Mother   . Diabetes Mother   . Hypertension Father   . Cancer Maternal Grandmother     cervical  . Breast cancer Maternal Grandmother     70's  . Diabetes Maternal Grandfather   . Breast cancer Maternal Aunt     50's  . Breast cancer  Maternal Aunt     60's    Allergies  Allergen Reactions  . Latex   . Prednisone   . Sulfacetamide Sodium     Current Outpatient Prescriptions on File Prior to Visit  Medication Sig Dispense Refill  . Calcium Carbonate-Vitamin D (CALCIUM + D PO) Take by mouth.        Marland Kitchen HYDROcodone-acetaminophen (NORCO/VICODIN) 5-325 MG per tablet Take 1 tablet by mouth every 6 (six) hours as needed for pain.  20 tablet  0  . potassium chloride SA (K-DUR,KLOR-CON) 20 MEQ tablet Take 20 mEq by mouth 2 (two) times daily.         No current facility-administered medications on file prior to visit.    BP 116/82  Pulse 82  Temp(Src) 98.7 F (37.1 C) (Oral)  Resp 16  Ht 5\' 3"  (1.6 m)  Wt 119 lb 1.3 oz (54.014 kg)  BMI 21.1 kg/m2  SpO2 99%  LMP 03/05/2013       Objective:   Physical Exam  Constitutional: She is oriented to person, place, and time. She appears well-developed and well-nourished. No distress.  HENT:  Head: Normocephalic  and atraumatic.  Cardiovascular: Normal rate and regular rhythm.   No murmur heard. Pulmonary/Chest: Effort normal and breath sounds normal. No respiratory distress. She has no wheezes. She has no rales. She exhibits no tenderness.  Lymphadenopathy:    She has no cervical adenopathy.  Neurological: She is alert and oriented to person, place, and time.  Skin: Skin is warm and dry.  Psychiatric: She has a normal mood and affect. Her behavior is normal. Judgment and thought content normal.          Assessment & Plan:

## 2013-03-18 NOTE — Assessment & Plan Note (Signed)
Chronic problem but has been better recently- followed by Dr. Loreta Ave- GI

## 2013-03-18 NOTE — Patient Instructions (Signed)
Please return on Friday for PPD reading. Follow up with Dr. Caryl Never as scheduled.

## 2013-03-18 NOTE — Assessment & Plan Note (Signed)
Ongoing issue for her. This is managed by Dr. Audie Box and she is scheduled for laparoscopy.

## 2013-03-18 NOTE — Assessment & Plan Note (Addendum)
Well controlled on symbicort, singulair, proair- managed by allergist.  Flu shot will be given today. Will plan pneumovax in 1 month. PPD today.

## 2013-03-20 ENCOUNTER — Encounter: Payer: Self-pay | Admitting: *Deleted

## 2013-03-20 LAB — TB SKIN TEST: TB Skin Test: NEGATIVE

## 2013-04-06 NOTE — Telephone Encounter (Signed)
Left message for pt to call regarding the below. 

## 2013-04-09 NOTE — Telephone Encounter (Signed)
Pt is aware of appointment 

## 2013-04-17 ENCOUNTER — Ambulatory Visit: Payer: BC Managed Care – PPO | Admitting: Family Medicine

## 2013-04-26 ENCOUNTER — Encounter: Payer: Self-pay | Admitting: Gynecology

## 2013-04-28 ENCOUNTER — Ambulatory Visit: Payer: Self-pay | Admitting: Podiatry

## 2013-05-21 ENCOUNTER — Ambulatory Visit: Payer: BC Managed Care – PPO | Admitting: Family Medicine

## 2013-06-10 ENCOUNTER — Ambulatory Visit: Payer: BC Managed Care – PPO | Admitting: Family Medicine

## 2013-06-16 ENCOUNTER — Encounter: Payer: Self-pay | Admitting: Family Medicine

## 2013-06-16 ENCOUNTER — Ambulatory Visit (INDEPENDENT_AMBULATORY_CARE_PROVIDER_SITE_OTHER): Payer: BC Managed Care – PPO | Admitting: Family Medicine

## 2013-06-16 VITALS — BP 124/74 | HR 90 | Temp 98.3°F | Ht 63.0 in | Wt 126.0 lb

## 2013-06-16 DIAGNOSIS — J019 Acute sinusitis, unspecified: Secondary | ICD-10-CM

## 2013-06-16 DIAGNOSIS — J45909 Unspecified asthma, uncomplicated: Secondary | ICD-10-CM

## 2013-06-16 DIAGNOSIS — J309 Allergic rhinitis, unspecified: Secondary | ICD-10-CM

## 2013-06-16 MED ORDER — CEFDINIR 300 MG PO CAPS: 300.0000 mg | ORAL_CAPSULE | Freq: Two times a day (BID) | ORAL | Status: DC

## 2013-06-16 MED ORDER — HYDROCOD POLST-CHLORPHEN POLST 10-8 MG/5ML PO LQCR: 5.0000 mL | Freq: Two times a day (BID) | ORAL | Status: DC | PRN

## 2013-06-16 NOTE — Progress Notes (Signed)
   Subjective:    Patient ID: Jamie Cameron, female    DOB: 07/19/1965, 48 y.o.   MRN: [id number]  HPI Patient is seen to establish here She has history of asthma and seasonal allergies. She is followed by allergist. She is maintained on allergy and asthma medications. Generally her asthma is fairly well controlled. She also has reported history of GERD and irritable bowel syndrome though those symptoms are stable.  She has acute issue of onset started last Tuesday of acute illness. She developed chills, body aches, vomiting, and low-grade fever. She felt better for a few days but then subsequently developed increasing cough and low grade fever 100.3 last night and increased facial pain and upper teeth pain. She started Allegra and NyQuil without much improvement. She's had problems with sinus infections in the past. She is allergic to sulfa. Her cough is especially bothersome at night. She is nonsmoker.  She sees gyn for wellness exams.  Past Medical History  Diagnosis Date  . Allergy   . Asthma   . Endometriosis   . Hypokalemia    Past Surgical History  Procedure Laterality Date  . Foot surgery    . Gyn surgery    . Chloecystectomy    . Pelvic laparoscopy  1996    endometriosis  . Breast surgery  1999    Left benign lymph node    reports that she has never smoked. She has never used smokeless tobacco. She reports that she does not drink alcohol or use illicit drugs. family history includes Breast cancer in her maternal aunt, maternal aunt, and maternal grandmother; Cancer in her maternal grandmother; Diabetes in her maternal grandfather and mother; Hypertension in her father and mother. Allergies  Allergen Reactions  . Latex   . Prednisone   . Sulfacetamide Sodium       Review of Systems  Constitutional: Positive for chills, appetite change and fatigue. Negative for fever.  HENT: Positive for congestion.   Respiratory: Positive for cough.   Cardiovascular: Negative for  chest pain.  Neurological: Positive for headaches.  Hematological: Negative for adenopathy.       Objective:   Physical Exam  Constitutional: She appears well-developed and well-nourished.  HENT:  Right Ear: External ear normal.  Left Ear: External ear normal.  Mouth/Throat: Oropharynx is clear and moist.  Neck: Neck supple.  Cardiovascular: Normal rate.   Pulmonary/Chest: Effort normal and breath sounds normal. No respiratory distress. She has no wheezes. She has no rales.  Lymphadenopathy:    She has no cervical adenopathy.  Neurological: She is alert.  Skin: No rash noted.          Assessment & Plan:  Febrile illness. Patient describes what sounds like viral syndrome last week but had recurrent fever after being afebrile.. We did not see any evidence clinically for pneumonia. Concern is secondary bacterial sinusitis. Start Omnicef 300 mg one twice a day for 10 days. Refill Tussionex for nighttime use as needed for severe cough. Followup when necessary Asthma and allergic rhinitis well controlled.

## 2013-06-16 NOTE — Progress Notes (Signed)
Pre visit review using our clinic review tool, if applicable. No additional management support is needed unless otherwise documented below in the visit note. 

## 2013-06-16 NOTE — Patient Instructions (Signed)

## 2013-06-23 ENCOUNTER — Telehealth: Payer: Self-pay | Admitting: Family Medicine

## 2013-06-23 MED ORDER — LEVOFLOXACIN 500 MG PO TABS: 500.0000 mg | ORAL_TABLET | Freq: Every day | ORAL | Status: DC

## 2013-06-23 NOTE — Telephone Encounter (Signed)
Pt states she is worse than from before she started the current med dr burchette put her on. cefdinir (OMNICEF) 300 MG capsule Pt has bad sore throat and has gone in her ear. Had transferred pt to triage, but pt called back and states unable to leave her number due to her phone. Asked me to pls leave a message. Unable to take off work again. Pharm: [facility]/ [address]

## 2013-06-23 NOTE — Telephone Encounter (Signed)
Is there another medication to prescribe for the patient

## 2013-06-23 NOTE — Telephone Encounter (Signed)
Rx sent to pharmacy and patient is aware 

## 2013-06-23 NOTE — Telephone Encounter (Signed)
Stop Omnicef and start Levaquin 500 mg po once daily for 10 days.  Consider OTC probiotic.

## 2014-02-23 ENCOUNTER — Ambulatory Visit (INDEPENDENT_AMBULATORY_CARE_PROVIDER_SITE_OTHER): Payer: BC Managed Care – PPO | Admitting: Family Medicine

## 2014-02-23 DIAGNOSIS — Z23 Encounter for immunization: Secondary | ICD-10-CM

## 2014-04-05 ENCOUNTER — Encounter: Payer: Self-pay | Admitting: Family Medicine

## 2014-06-02 ENCOUNTER — Encounter: Payer: Self-pay | Admitting: Gynecology

## 2014-06-14 ENCOUNTER — Encounter: Payer: Self-pay | Admitting: Gynecology

## 2014-06-24 ENCOUNTER — Encounter: Payer: Self-pay | Admitting: Family Medicine

## 2014-06-24 ENCOUNTER — Ambulatory Visit (INDEPENDENT_AMBULATORY_CARE_PROVIDER_SITE_OTHER): Payer: BC Managed Care – PPO | Admitting: Family Medicine

## 2014-06-24 VITALS — BP 124/80 | HR 84 | Temp 97.7°F | Wt 123.0 lb

## 2014-06-24 DIAGNOSIS — T17908A Unspecified foreign body in respiratory tract, part unspecified causing other injury, initial encounter: Secondary | ICD-10-CM

## 2014-06-24 DIAGNOSIS — T17998A Other foreign object in respiratory tract, part unspecified causing other injury, initial encounter: Secondary | ICD-10-CM

## 2014-06-24 DIAGNOSIS — K219 Gastro-esophageal reflux disease without esophagitis: Secondary | ICD-10-CM

## 2014-06-24 MED ORDER — LEVOFLOXACIN 500 MG PO TABS: 500.0000 mg | ORAL_TABLET | Freq: Every day | ORAL | Status: DC

## 2014-06-24 MED ORDER — HYDROCOD POLST-CHLORPHEN POLST 10-8 MG/5ML PO LQCR: 5.0000 mL | Freq: Two times a day (BID) | ORAL | Status: DC | PRN

## 2014-06-24 NOTE — Progress Notes (Signed)
Pre visit review using our clinic review tool, if applicable. No additional management support is needed unless otherwise documented below in the visit note. 

## 2014-06-24 NOTE — Patient Instructions (Signed)

## 2014-06-24 NOTE — Progress Notes (Signed)
   Subjective:    Patient ID: Jamie Cameron, female    DOB: 1966-05-02, 49 y.o.   MRN: [id number]  HPI Patient seen with concern because of increased cough after episode of vomiting last night. She has a very long history of GERD and "sensitivity" to foods and apparently after eating last night something with increased fat content she had episode of vomiting. No history of known food allergies .  She felt like she might have aspirated some acidic liquid and had extreme shortness of breath immediately afterwards. This was followed by severe coughing all night. She's not had any fever. She has some reactive airway history and sees allergist and does have albuterol and Symbicort. She was very concerned because she had episode of aspiration pneumonia previously.  She does drink a lot of caffeine with coffee 6 cups per day. She is a vegetarian. She denies any dysphagia. She does not have daily GERD symptoms. Currently does not take any acid suppressors.  Past Medical History  Diagnosis Date  . Allergy   . Asthma   . Endometriosis   . Hypokalemia    Past Surgical History  Procedure Laterality Date  . Foot surgery    . Gyn surgery    . Chloecystectomy    . Pelvic laparoscopy  1996    endometriosis  . Breast surgery  1999    Left benign lymph node    reports that she has never smoked. She has never used smokeless tobacco. She reports that she does not drink alcohol or use illicit drugs. family history includes Breast cancer in her maternal aunt, maternal aunt, and maternal grandmother; Cancer in her maternal grandmother; Diabetes in her maternal grandfather and mother; Hypertension in her father and mother. Allergies  Allergen Reactions  . Latex   . Prednisone   . Sulfacetamide Sodium       Review of Systems  Constitutional: Negative for fever, chills and unexpected weight change.  HENT: Negative for trouble swallowing.   Respiratory: Positive for cough and shortness of breath.  Negative for wheezing.   Cardiovascular: Negative for chest pain.  Gastrointestinal: Positive for vomiting. Negative for abdominal pain and abdominal distention.  Genitourinary: Negative for dysuria.  Neurological: Negative for dizziness and weakness.       Objective:   Physical Exam  Constitutional: She appears well-developed and well-nourished. No distress.  HENT:  Right Ear: External ear normal.  Left Ear: External ear normal.  Mouth/Throat: Oropharynx is clear and moist.  Cardiovascular: Normal rate and regular rhythm.   Pulmonary/Chest: Effort normal and breath sounds normal. No respiratory distress. She has no wheezes. She has no rales.  Neurological: She is alert.          Assessment & Plan:  Recent aspiration as above. She does not have any clinical findings or fever to suggest aspiration pneumonia at this time. She is very concerned because of prior history of this. With the upcoming inclement weather we did agree to prescription for Levaquin 500 milligrams 1 daily for 10 days to start only if she has fever or worsening symptoms. Otherwise, continue albuterol as needed. Wrote for Limited Tussionex 1 teaspoon daily at bedtime for severe cough.  Regarding her GERD. We've given handout on dietary modification and consider over-the-counter H2 blocker such as Zantac or Pepcid. Gradually scale back caffeine use. Consider PPI use if not improving with the above. She is also frequently using peppermint gum and we have strongly advised reducing mints.

## 2014-07-02 ENCOUNTER — Encounter: Payer: BC Managed Care – PPO | Admitting: Gynecology

## 2014-07-12 ENCOUNTER — Encounter: Payer: BC Managed Care – PPO | Admitting: Gynecology

## 2014-08-18 ENCOUNTER — Encounter: Payer: Self-pay | Admitting: Gynecology

## 2014-08-31 ENCOUNTER — Ambulatory Visit (INDEPENDENT_AMBULATORY_CARE_PROVIDER_SITE_OTHER): Payer: BC Managed Care – PPO | Admitting: Gynecology

## 2014-08-31 ENCOUNTER — Encounter: Payer: Self-pay | Admitting: Gynecology

## 2014-08-31 ENCOUNTER — Other Ambulatory Visit (HOSPITAL_COMMUNITY)
Admission: RE | Admit: 2014-08-31 | Discharge: 2014-08-31 | Disposition: A | Discharge status: Home / Self Care | Payer: BC Managed Care – PPO | Source: Ambulatory Visit | Attending: Gynecology | Admitting: Gynecology

## 2014-08-31 VITALS — BP 120/76 | Ht 62.0 in | Wt 126.0 lb

## 2014-08-31 DIAGNOSIS — M545 Low back pain, unspecified: Secondary | ICD-10-CM

## 2014-08-31 DIAGNOSIS — R1012 Left upper quadrant pain: Secondary | ICD-10-CM

## 2014-08-31 DIAGNOSIS — Z01419 Encounter for gynecological examination (general) (routine) without abnormal findings: Secondary | ICD-10-CM | POA: Diagnosis present

## 2014-08-31 LAB — COMPREHENSIVE METABOLIC PANEL
ALT: 15 U/L (ref 0–35)
AST: 19 U/L (ref 0–37)
Albumin: 4.3 g/dL (ref 3.5–5.2)
Alkaline Phosphatase: 49 U/L (ref 39–117)
BUN: 9 mg/dL (ref 6–23)
CO2: 25 mEq/L (ref 19–32)
Calcium: 9.2 mg/dL (ref 8.4–10.5)
Chloride: 103 mEq/L (ref 96–112)
Creat: 0.67 mg/dL (ref 0.50–1.10)
GLUCOSE: 70 mg/dL (ref 70–99)
POTASSIUM: 3.8 meq/L (ref 3.5–5.3)
SODIUM: 138 meq/L (ref 135–145)
Total Bilirubin: 0.6 mg/dL (ref 0.2–1.2)
Total Protein: 6.5 g/dL (ref 6.0–8.3)

## 2014-08-31 LAB — CBC WITH DIFFERENTIAL/PLATELET
BASOS ABS: 0.1 10*3/uL (ref 0.0–0.1)
Basophils Relative: 1 % (ref 0–1)
Eosinophils Absolute: 0.1 10*3/uL (ref 0.0–0.7)
Eosinophils Relative: 1 % (ref 0–5)
HEMATOCRIT: 42.9 % (ref 36.0–46.0)
Hemoglobin: 14.4 g/dL (ref 12.0–15.0)
Lymphocytes Relative: 25 % (ref 12–46)
Lymphs Abs: 2.4 10*3/uL (ref 0.7–4.0)
MCH: 30.7 pg (ref 26.0–34.0)
MCHC: 33.6 g/dL (ref 30.0–36.0)
MCV: 91.5 fL (ref 78.0–100.0)
MPV: 11.1 fL (ref 8.6–12.4)
Monocytes Absolute: 0.7 10*3/uL (ref 0.1–1.0)
Monocytes Relative: 7 % (ref 3–12)
NEUTROS ABS: 6.2 10*3/uL (ref 1.7–7.7)
Neutrophils Relative %: 66 % (ref 43–77)
PLATELETS: 359 10*3/uL (ref 150–400)
RBC: 4.69 MIL/uL (ref 3.87–5.11)
RDW: 13.6 % (ref 11.5–15.5)
WBC: 9.4 10*3/uL (ref 4.0–10.5)

## 2014-08-31 LAB — LIPID PANEL
Cholesterol: 213 mg/dL — ABNORMAL HIGH (ref 0–200)
HDL: 54 mg/dL (ref >=46)
LDL Cholesterol: 134 mg/dL — ABNORMAL HIGH (ref 0–99)
Total CHOL/HDL Ratio: 3.9 Ratio
Triglycerides: 126 mg/dL (ref <150)
VLDL: 25 mg/dL (ref 0–40)

## 2014-08-31 MED ORDER — SUMATRIPTAN SUCCINATE 50 MG PO TABS: 50.0000 mg | ORAL_TABLET | ORAL | Status: DC | PRN

## 2014-08-31 MED ORDER — IBUPROFEN 800 MG PO TABS: 800.0000 mg | ORAL_TABLET | Freq: Three times a day (TID) | ORAL | Status: DC | PRN

## 2014-08-31 MED ORDER — CYCLOBENZAPRINE HCL 10 MG PO TABS: 10.0000 mg | ORAL_TABLET | Freq: Three times a day (TID) | ORAL | Status: DC | PRN

## 2014-08-31 NOTE — Patient Instructions (Signed)
If the left upper abdominal pain continues and I would see her gastroenterologist. If the left lower back pain continues and I would see an orthopedic surgeon.  You may obtain a copy of any labs that were done today by logging onto MyChart as outlined in the instructions provided with your AVS (after visit summary). The office will not call with normal lab results but certainly if there are any significant abnormalities then we will contact you.   Health Maintenance, Female A healthy lifestyle and preventative care can promote health and wellness.  Maintain regular health, dental, and eye exams.  Eat a healthy diet. Foods like vegetables, fruits, whole grains, low-fat dairy products, and lean protein foods contain the nutrients you need without too many calories. Decrease your intake of foods high in solid fats, added sugars, and salt. Get information about a proper diet from your caregiver, if necessary.  Regular physical exercise is one of the most important things you can do for your health. Most adults should get at least 150 minutes of moderate-intensity exercise (any activity that increases your heart rate and causes you to sweat) each week. In addition, most adults need muscle-strengthening exercises on 2 or more days a week.   Maintain a healthy weight. The body mass index (BMI) is a screening tool to identify possible weight problems. It provides an estimate of body fat based on height and weight. Your caregiver can help determine your BMI, and can help you achieve or maintain a healthy weight. For adults 20 years and older:  A BMI below 18.5 is considered underweight.  A BMI of 18.5 to 24.9 is normal.  A BMI of 25 to 29.9 is considered overweight.  A BMI of 30 and above is considered obese.  Maintain normal blood lipids and cholesterol by exercising and minimizing your intake of saturated fat. Eat a balanced diet with plenty of fruits and vegetables. Blood tests for lipids and  cholesterol should begin at age 72 and be repeated every 5 years. If your lipid or cholesterol levels are high, you are over 50, or you are a high risk for heart disease, you may need your cholesterol levels checked more frequently.Ongoing high lipid and cholesterol levels should be treated with medicines if diet and exercise are not effective.  If you smoke, find out from your caregiver how to quit. If you do not use tobacco, do not start.  Lung cancer screening is recommended for adults aged 67 80 years who are at high risk for developing lung cancer because of a history of smoking. Yearly low-dose computed tomography (CT) is recommended for people who have at least a 30-pack-year history of smoking and are a current smoker or have quit within the past 15 years. A pack year of smoking is smoking an average of 1 pack of cigarettes a day for 1 year (for example: 1 pack a day for 30 years or 2 packs a day for 15 years). Yearly screening should continue until the smoker has stopped smoking for at least 15 years. Yearly screening should also be stopped for people who develop a health problem that would prevent them from having lung cancer treatment.  If you are pregnant, do not drink alcohol. If you are breastfeeding, be very cautious about drinking alcohol. If you are not pregnant and choose to drink alcohol, do not exceed 1 drink per day. One drink is considered to be 12 ounces (355 mL) of beer, 5 ounces (148 mL) of wine, or 1.5  ounces (44 mL) of liquor.  Avoid use of street drugs. Do not share needles with anyone. Ask for help if you need support or instructions about stopping the use of drugs.  High blood pressure causes heart disease and increases the risk of stroke. Blood pressure should be checked at least every 1 to 2 years. Ongoing high blood pressure should be treated with medicines, if weight loss and exercise are not effective.  If you are 55 to 48 years old, ask your caregiver if you should  take aspirin to prevent strokes.  Diabetes screening involves taking a blood sample to check your fasting blood sugar level. This should be done once every 3 years, after age 45, if you are within normal weight and without risk factors for diabetes. Testing should be considered at a younger age or be carried out more frequently if you are overweight and have at least 1 risk factor for diabetes.  Breast cancer screening is essential preventative care for women. You should practice "breast self-awareness." This means understanding the normal appearance and feel of your breasts and may include breast self-examination. Any changes detected, no matter how small, should be reported to a caregiver. Women in their 20s and 30s should have a clinical breast exam (CBE) by a caregiver as part of a regular health exam every 1 to 3 years. After age 40, women should have a CBE every year. Starting at age 40, women should consider having a mammogram (breast X-ray) every year. Women who have a family history of breast cancer should talk to their caregiver about genetic screening. Women at a high risk of breast cancer should talk to their caregiver about having an MRI and a mammogram every year.  Breast cancer gene (BRCA)-related cancer risk assessment is recommended for women who have family members with BRCA-related cancers. BRCA-related cancers include breast, ovarian, tubal, and peritoneal cancers. Having family members with these cancers may be associated with an increased risk for harmful changes (mutations) in the breast cancer genes BRCA1 and BRCA2. Results of the assessment will determine the need for genetic counseling and BRCA1 and BRCA2 testing.  The Pap test is a screening test for cervical cancer. Women should have a Pap test starting at age 21. Between ages 21 and 29, Pap tests should be repeated every 2 years. Beginning at age 30, you should have a Pap test every 3 years as long as the past 3 Pap tests have  been normal. If you had a hysterectomy for a problem that was not cancer or a condition that could lead to cancer, then you no longer need Pap tests. If you are between ages 65 and 70, and you have had normal Pap tests going back 10 years, you no longer need Pap tests. If you have had past treatment for cervical cancer or a condition that could lead to cancer, you need Pap tests and screening for cancer for at least 20 years after your treatment. If Pap tests have been discontinued, risk factors (such as a new sexual partner) need to be reassessed to determine if screening should be resumed. Some women have medical problems that increase the chance of getting cervical cancer. In these cases, your caregiver may recommend more frequent screening and Pap tests.  The human papillomavirus (HPV) test is an additional test that may be used for cervical cancer screening. The HPV test looks for the virus that can cause the cell changes on the cervix. The cells collected during the Pap test   can be tested for HPV. The HPV test could be used to screen women aged 58 years and older, and should be used in women of any age who have unclear Pap test results. After the age of 45, women should have HPV testing at the same frequency as a Pap test.  Colorectal cancer can be detected and often prevented. Most routine colorectal cancer screening begins at the age of 49 and continues through age 34. However, your caregiver may recommend screening at an earlier age if you have risk factors for colon cancer. On a yearly basis, your caregiver may provide home test kits to check for hidden blood in the stool. Use of a small camera at the end of a tube, to directly examine the colon (sigmoidoscopy or colonoscopy), can detect the earliest forms of colorectal cancer. Talk to your caregiver about this at age 24, when routine screening begins. Direct examination of the colon should be repeated every 5 to 10 years through age 74, unless early  forms of pre-cancerous polyps or small growths are found.  Hepatitis C blood testing is recommended for all people born from [birth year] through 1965 and any individual with known risks for hepatitis C.  Practice safe sex. Use condoms and avoid high-risk sexual practices to reduce the spread of sexually transmitted infections (STIs). Sexually active women aged 39 and younger should be checked for Chlamydia, which is a common sexually transmitted infection. Older women with new or multiple partners should also be tested for Chlamydia. Testing for other STIs is recommended if you are sexually active and at increased risk.  Osteoporosis is a disease in which the bones lose minerals and strength with aging. This can result in serious bone fractures. The risk of osteoporosis can be identified using a bone density scan. Women ages 48 and over and women at risk for fractures or osteoporosis should discuss screening with their caregivers. Ask your caregiver whether you should be taking a calcium supplement or vitamin D to reduce the rate of osteoporosis.  Menopause can be associated with physical symptoms and risks. Hormone replacement therapy is available to decrease symptoms and risks. You should talk to your caregiver about whether hormone replacement therapy is right for you.  Use sunscreen. Apply sunscreen liberally and repeatedly throughout the day. You should seek shade when your shadow is shorter than you. Protect yourself by wearing long sleeves, pants, a wide-brimmed hat, and sunglasses year round, whenever you are outdoors.  Notify your caregiver of new moles or changes in moles, especially if there is a change in shape or color. Also notify your caregiver if a mole is larger than the size of a pencil eraser.  Stay current with your immunizations. Document Released: 12/04/2010 Document Revised: 09/15/2012 Document Reviewed: 12/04/2010 [facility] Patient Information 2014 [place].

## 2014-08-31 NOTE — Progress Notes (Signed)
Jamie EllisCindy L Lenger 07/25/1965 [id number]        49 y.o.  G1P1 for annual exam.  Several issues noted below.  Past medical history,surgical history, problem list, medications, allergies, family history and social history were all reviewed and documented as reviewed in the EPIC chart.  ROS:  Performed with pertinent positives and negatives included in the history, assessment and plan.   Additional significant findings :  none   Exam: [facility] personassistant Filed Vitals:   08/31/14 1400  BP: 120/76  Height: 5\' 2"  (1.575 m)  Weight: 126 lb (57.153 kg)   General appearance:  Normal affect, orientation and appearance. Skin: Grossly normal HEENT: Without gross lesions.  No cervical or supraclavicular adenopathy. Thyroid normal.  Lungs:  Clear without wheezing, rales or rhonchi Cardiac: RR, without RMG Abdominal:  Soft, nontender, without masses, guarding, rebound, organomegaly or hernia Spine with left lower paraspinous muscle spasm and tenderness to palpation. Breasts:  Examined lying and sitting without masses, retractions, discharge or axillary adenopathy. Pelvic:  Ext/BUS/vagina normal  Cervix normal. Pap smear done  Uterus anteverted, normal size, shape and contour, midline and mobile nontender   Adnexa  Without masses or tenderness    Anus and perineum  Normal   Rectovaginal  Normal sphincter tone without palpated masses or tenderness.    Assessment/Plan:  49 y.o. G1P1 female for annual exam with regular menses, vasectomy birth control.   1. Low back pain. Patient notes acute onset of low back pain with bending yesterday.  Exam shows left lower upper lumbar paraspinous muscle spasm tender to palpation. Recommend heat, ibuprofen 800 mg #30 with 2 refills and Flexeril 10 mg #30 at bedtime, precautions as far as sedation. Follow up if this persists with orthopedics. 2. Left upper abdominal pain for proximally one week. Start stabbing aching, comes and goes. Having some nausea and diarrhea  although notes this seems to be more food related depending on what she eats. Exam without acute findings.  Recommend follow up with GI if continues. 3. Pap smear/HPV negative 2013. Pap smear done today. No history of significant abnormal Pap smears. 4. Mammography 05/2014. Continue with annual mammography. SBE another reviewed. 5. Health maintenance. Baseline CBC comprehensive metabolic panel lipid profile urinalysis TSH ordered. Follow up in one year, sooner as needed.     Dara LordsFONTAINE,Alaia Lordi P MD, 3:09 PM 08/31/2014

## 2014-09-01 ENCOUNTER — Other Ambulatory Visit: Payer: Self-pay | Admitting: Gynecology

## 2014-09-01 DIAGNOSIS — E7800 Pure hypercholesterolemia, unspecified: Secondary | ICD-10-CM

## 2014-09-01 LAB — TSH: TSH: 0.869 u[IU]/mL (ref 0.350–4.500)

## 2014-09-01 LAB — URINALYSIS W MICROSCOPIC + REFLEX CULTURE
BILIRUBIN URINE: NEGATIVE
Bacteria, UA: NONE SEEN
CASTS: NONE SEEN
CRYSTALS: NONE SEEN
Glucose, UA: NEGATIVE mg/dL
Hgb urine dipstick: NEGATIVE
KETONES UR: NEGATIVE mg/dL
Leukocytes, UA: NEGATIVE
Nitrite: NEGATIVE
PH: 5 (ref 5.0–8.0)
Protein, ur: NEGATIVE mg/dL
SQUAMOUS EPITHELIAL / LPF: NONE SEEN
Specific Gravity, Urine: >1.03 — ABNORMAL HIGH (ref 1.005–1.030)
Urobilinogen, UA: 0.2 mg/dL (ref 0.0–1.0)

## 2014-09-02 LAB — CYTOLOGY - PAP

## 2014-09-03 ENCOUNTER — Other Ambulatory Visit: Payer: BC Managed Care – PPO

## 2014-09-03 ENCOUNTER — Telehealth: Payer: Self-pay | Admitting: *Deleted

## 2014-09-03 ENCOUNTER — Encounter: Payer: Self-pay | Admitting: *Deleted

## 2014-09-03 DIAGNOSIS — E7800 Pure hypercholesterolemia, unspecified: Secondary | ICD-10-CM

## 2014-09-03 LAB — LIPID PANEL
CHOL/HDL RATIO: 3.7 ratio
Cholesterol: 189 mg/dL (ref 0–200)
HDL: 51 mg/dL (ref >=46)
LDL CALC: 119 mg/dL — AB (ref 0–99)
TRIGLYCERIDES: 94 mg/dL (ref <150)
VLDL: 19 mg/dL (ref 0–40)

## 2014-09-03 NOTE — Telephone Encounter (Signed)
Note printed and left up front for pick up

## 2014-09-03 NOTE — Telephone Encounter (Signed)
Okay to provide note

## 2014-09-03 NOTE — Telephone Encounter (Signed)
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 Pt would like to have note stating you prescribed these medications for school. Please advise

## 2014-10-13 ENCOUNTER — Ambulatory Visit (INDEPENDENT_AMBULATORY_CARE_PROVIDER_SITE_OTHER): Payer: BC Managed Care – PPO | Admitting: Family Medicine

## 2014-10-13 DIAGNOSIS — Z9229 Personal history of other drug therapy: Secondary | ICD-10-CM

## 2014-10-13 DIAGNOSIS — Z0000 Encounter for general adult medical examination without abnormal findings: Secondary | ICD-10-CM | POA: Diagnosis not present

## 2014-10-13 DIAGNOSIS — Z111 Encounter for screening for respiratory tuberculosis: Secondary | ICD-10-CM | POA: Diagnosis not present

## 2014-10-13 NOTE — Progress Notes (Signed)
Pre visit review using our clinic review tool, if applicable. No additional management support is needed unless otherwise documented below in the visit note. 

## 2014-10-13 NOTE — Progress Notes (Signed)
   Subjective:    Patient ID: Jamie Cameron, female    DOB: 01-13-1966, 49 y.o.   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 She states she had clinical chickenpox as a child. She did have Varicella antibody test back in 2012 which proved that she is Varicella immune. She does not have a copy of all of her other immunizations today. Her tetanus is up-to-date. She thinks she's had hepatitis B vaccine previously. She thinks she has had previous antibody testing for measles mumps rubella as well as hepatitis B but we do not have a record of those at this time. She is very healthy generally. No history of tuberculosis. No recent concerning symptoms. No history of prior positive PPD.  Past Medical History  Diagnosis Date  . Allergy   . Asthma   . Endometriosis   . Hypokalemia   . Migraine    Past Surgical History  Procedure Laterality Date  . Foot surgery    . Pelvic laparoscopy  1996    endometriosis  . Breast surgery  1999    Left benign lymph node  . Cholecystectomy      reports that she has never smoked. She has never used smokeless tobacco. She reports that she does not drink alcohol or use illicit drugs. family history includes Breast cancer in her maternal aunt, maternal aunt, and maternal grandmother; Cancer in her maternal grandmother; Diabetes in her maternal grandfather and mother; Hypertension in her father and mother. Allergies  Allergen Reactions  . Sulfacetamide Sodium Nausea And Vomiting  . Prednisone Nausea And Vomiting  . Latex Rash      Review of Systems  Constitutional: Negative for fatigue.  Eyes: Negative for visual disturbance.  Respiratory: Negative for cough, chest tightness, shortness of breath and wheezing.   Cardiovascular: Negative for chest pain, palpitations and leg swelling.  Musculoskeletal: Negative for arthralgias.  Skin: Negative for rash.  Neurological: Negative for dizziness, seizures, syncope,  weakness, light-headedness and headaches.       Objective:   Physical Exam  Constitutional: She appears well-developed and well-nourished. No distress.  HENT:  Right Ear: External ear normal.  Left Ear: External ear normal.  Mouth/Throat: Oropharynx is clear and moist.  Neck: Neck supple. No thyromegaly present.  Cardiovascular: Normal rate and regular rhythm.   Pulmonary/Chest: Effort normal and breath sounds normal. No respiratory distress. She has no wheezes. She has no rales.  Lymphadenopathy:    She has no cervical adenopathy.          Assessment & Plan:  Patient here for health assessment form completion. PPD placed and return in 48 hours have this read. She will try to confirm if she's had previous antibody testing to hepatitis B and measles mumps rubella. Her program is apparently requiring proof of immune status.

## 2014-10-20 ENCOUNTER — Ambulatory Visit (INDEPENDENT_AMBULATORY_CARE_PROVIDER_SITE_OTHER): Payer: BC Managed Care – PPO | Admitting: Family Medicine

## 2014-10-20 DIAGNOSIS — Z111 Encounter for screening for respiratory tuberculosis: Secondary | ICD-10-CM

## 2014-10-22 LAB — TB SKIN TEST
Induration: 0 mm
TB Skin Test: NEGATIVE

## 2015-02-15 ENCOUNTER — Ambulatory Visit (INDEPENDENT_AMBULATORY_CARE_PROVIDER_SITE_OTHER): Payer: BC Managed Care – PPO

## 2015-02-15 DIAGNOSIS — Z23 Encounter for immunization: Secondary | ICD-10-CM | POA: Diagnosis not present

## 2015-02-24 ENCOUNTER — Telehealth: Payer: Self-pay | Admitting: Family Medicine

## 2015-02-24 DIAGNOSIS — Z0000 Encounter for general adult medical examination without abnormal findings: Secondary | ICD-10-CM

## 2015-02-24 NOTE — Telephone Encounter (Signed)
Pt needs mmr titer for nursing school. Is is ok to schedule?

## 2015-02-24 NOTE — Telephone Encounter (Signed)
yes

## 2015-02-25 ENCOUNTER — Other Ambulatory Visit (INDEPENDENT_AMBULATORY_CARE_PROVIDER_SITE_OTHER): Payer: BC Managed Care – PPO

## 2015-02-25 DIAGNOSIS — Z0000 Encounter for general adult medical examination without abnormal findings: Secondary | ICD-10-CM

## 2015-02-25 NOTE — Telephone Encounter (Signed)
Titer ordered.  Please call and schedule patient.

## 2015-02-25 NOTE — Telephone Encounter (Signed)
Pt has been sch

## 2015-02-28 LAB — MEASLES/MUMPS/RUBELLA IMMUNITY
Mumps IgG: 75.3 AU/mL — ABNORMAL HIGH (ref <9.00)
RUBELLA: 1.45 {index} — AB (ref <0.90)
Rubeola IgG: 56.4 AU/mL — ABNORMAL HIGH (ref <25.00)

## 2015-03-01 NOTE — Progress Notes (Signed)
Waiting for pt response

## 2015-08-29 ENCOUNTER — Other Ambulatory Visit: Payer: Self-pay | Admitting: Gynecology

## 2015-08-29 ENCOUNTER — Telehealth: Payer: Self-pay | Admitting: *Deleted

## 2015-08-29 DIAGNOSIS — R102 Pelvic and perineal pain: Secondary | ICD-10-CM

## 2015-08-29 NOTE — Telephone Encounter (Signed)
Pt informed with the below note, transferred to appointments

## 2015-08-29 NOTE — Telephone Encounter (Signed)
Pt called c/o left lower quad  pain, noticed pain x 2 weeks ago but the last 3 days pain level has reached up to level 8. Pt states she is level 6 now taking motrin now and it helps for now, pain comes and goes, starts out sharp then dull, sex uncomfortable as well, states she had this same pain 2014. History of endometriosis, made it very clear she can not come in today due to meeting, requesting to be seen tomorrow for exam okay to work patient in? Please advise

## 2015-08-29 NOTE — Telephone Encounter (Signed)
I would schedule ultrasound with exam whenever that can be arranged

## 2015-09-01 ENCOUNTER — Ambulatory Visit (INDEPENDENT_AMBULATORY_CARE_PROVIDER_SITE_OTHER): Payer: BC Managed Care – PPO

## 2015-09-01 ENCOUNTER — Encounter: Payer: Self-pay | Admitting: Gynecology

## 2015-09-01 ENCOUNTER — Other Ambulatory Visit: Payer: Self-pay | Admitting: Gynecology

## 2015-09-01 ENCOUNTER — Ambulatory Visit (INDEPENDENT_AMBULATORY_CARE_PROVIDER_SITE_OTHER): Payer: BC Managed Care – PPO | Admitting: Gynecology

## 2015-09-01 VITALS — BP 124/78

## 2015-09-01 DIAGNOSIS — R300 Dysuria: Secondary | ICD-10-CM

## 2015-09-01 DIAGNOSIS — N83202 Unspecified ovarian cyst, left side: Secondary | ICD-10-CM

## 2015-09-01 DIAGNOSIS — N3001 Acute cystitis with hematuria: Secondary | ICD-10-CM

## 2015-09-01 DIAGNOSIS — R102 Pelvic and perineal pain: Secondary | ICD-10-CM | POA: Diagnosis not present

## 2015-09-01 LAB — URINALYSIS W MICROSCOPIC + REFLEX CULTURE
BILIRUBIN URINE: NEGATIVE
CASTS: NONE SEEN [LPF]
Glucose, UA: NEGATIVE
KETONES UR: NEGATIVE
Leukocytes, UA: NEGATIVE
NITRITE: NEGATIVE
PROTEIN: NEGATIVE
WBC, UA: NONE SEEN WBC/HPF (ref <=5)
YEAST: NONE SEEN [HPF]
pH: 5 (ref 5.0–8.0)

## 2015-09-01 MED ORDER — CIPROFLOXACIN HCL 250 MG PO TABS: 250.0000 mg | ORAL_TABLET | Freq: Two times a day (BID) | ORAL | Status: DC

## 2015-09-01 MED ORDER — PHENAZOPYRIDINE HCL 200 MG PO TABS
200.0000 mg | ORAL_TABLET | Freq: Three times a day (TID) | ORAL | Status: DC | PRN
Start: 2015-09-01 — End: 2015-11-02

## 2015-09-01 NOTE — Patient Instructions (Signed)
Start the antibiotics twice daily for 7 days.  Take the Pyridium 3 times daily as needed for bladder discomfort.  Office will contact you to arrange for urology appointment.  Follow up for repeat ultrasound in 6 weeks

## 2015-09-01 NOTE — Progress Notes (Signed)
    Jamie EllisCindy L Cameron 1965/09/17 [id number]        50 y.o.  G1P1 presents complaining of abdominal pain starting in the right flank moving to the left flank and into the right and left pelvis of the past several weeks. Having difficulty urinating with bladder pressure and discomfort. Notes some intermittent blood tinging in her urine.  No fever or chills. No nausea vomiting diarrhea constipation. Does have a history of endometriosis in the past. Is having consistent deep dyspareunia primarily on the left.  Past medical history,surgical history, problem list, medications, allergies, family history and social history were all reviewed and documented in the EPIC chart.  Directed ROS with pertinent positives and negatives documented in the history of present illness/assessment and plan.  Exam: [place] assistant Filed Vitals:   09/01/15 1548  BP: 124/78   General appearance:  Normal Spine straight without CVA tenderness. Abdomen soft nontender without masses guarding rebound Pelvic external BUS vagina normal. Cervix grossly normal. Uterus normal size midline mobile nontender. Adnexa without gross masses or tenderness.  Ultrasound shows uterus normal size and echotexture. Endometrial echo 2.7 mm. Right ovary normal. Left ovary with thin-walled 31 mm mean cyst with thin septation and layering of debris. Negative color flow. Cul-de-sac negative.  Assessment/Plan:  50 y.o. G1P1 with history as above. Urinalysis shows greater than 60 RBCs with few bacteria. She's noticed gross blood tinging in her urine intermittently.  Discussed with patient my suspicion she may have a kidney stone as a source for pain and bladder symptoms. Will cover with ciprofloxacin 250 mg twice a day 7 days and pyridium 200 mg 3 times a day as needed for discomfort. Will arrange for urology evaluation. Discussed the left ovarian cyst as possible hemorrhagic physiologic cyst or possible endometrioma. She does have a history of  endometriosis and she is having left-sided dyspareunia. Will repeat the ultrasound in 6 weeks to relook at the side. Discussed possible laparoscopy if pain or cyst persists.    Dara LordsFONTAINE,Aniket Paye P MD, 4:08 PM 09/01/2015

## 2015-09-02 ENCOUNTER — Telehealth: Payer: Self-pay | Admitting: *Deleted

## 2015-09-02 ENCOUNTER — Ambulatory Visit: Payer: BC Managed Care – PPO | Admitting: Urology

## 2015-09-02 NOTE — Telephone Encounter (Signed)
-----   Message from Dara Lordsimothy P Fontaine, MD sent at 09/01/2015  4:12 PM EDT ----- Patient needs asap referral to urology reference pain and hematuria suspicious for stone.

## 2015-09-02 NOTE — Telephone Encounter (Signed)
Appointment 09/09/15 @ 2:00pm with Dr.Ottelin notes faxed, pt informed with the below note.

## 2015-09-03 LAB — URINE CULTURE

## 2015-09-06 ENCOUNTER — Encounter: Payer: Self-pay | Admitting: Gynecology

## 2015-09-09 DIAGNOSIS — R350 Frequency of micturition: Secondary | ICD-10-CM | POA: Diagnosis not present

## 2015-09-09 DIAGNOSIS — Z0000 Encounter for general adult medical examination without abnormal findings: Secondary | ICD-10-CM | POA: Diagnosis not present

## 2015-09-09 DIAGNOSIS — R1032 Left lower quadrant pain: Secondary | ICD-10-CM | POA: Diagnosis not present

## 2015-09-09 DIAGNOSIS — R3121 Asymptomatic microscopic hematuria: Secondary | ICD-10-CM | POA: Diagnosis not present

## 2015-09-14 DIAGNOSIS — R1032 Left lower quadrant pain: Secondary | ICD-10-CM | POA: Diagnosis not present

## 2015-09-14 DIAGNOSIS — R3121 Asymptomatic microscopic hematuria: Secondary | ICD-10-CM | POA: Diagnosis not present

## 2015-09-14 DIAGNOSIS — Z0000 Encounter for general adult medical examination without abnormal findings: Secondary | ICD-10-CM | POA: Diagnosis not present

## 2015-09-30 DIAGNOSIS — Z0000 Encounter for general adult medical examination without abnormal findings: Secondary | ICD-10-CM | POA: Diagnosis not present

## 2015-09-30 DIAGNOSIS — N201 Calculus of ureter: Secondary | ICD-10-CM | POA: Diagnosis not present

## 2015-09-30 DIAGNOSIS — R1032 Left lower quadrant pain: Secondary | ICD-10-CM | POA: Diagnosis not present

## 2015-09-30 DIAGNOSIS — R3121 Asymptomatic microscopic hematuria: Secondary | ICD-10-CM | POA: Diagnosis not present

## 2015-09-30 DIAGNOSIS — R350 Frequency of micturition: Secondary | ICD-10-CM | POA: Diagnosis not present

## 2015-10-12 ENCOUNTER — Ambulatory Visit: Payer: BC Managed Care – PPO | Admitting: Gynecology

## 2015-10-12 ENCOUNTER — Other Ambulatory Visit: Payer: BC Managed Care – PPO

## 2015-10-21 DIAGNOSIS — N201 Calculus of ureter: Secondary | ICD-10-CM | POA: Diagnosis not present

## 2015-10-21 DIAGNOSIS — R350 Frequency of micturition: Secondary | ICD-10-CM | POA: Diagnosis not present

## 2015-10-21 DIAGNOSIS — R3121 Asymptomatic microscopic hematuria: Secondary | ICD-10-CM | POA: Diagnosis not present

## 2015-10-21 DIAGNOSIS — Z0000 Encounter for general adult medical examination without abnormal findings: Secondary | ICD-10-CM | POA: Diagnosis not present

## 2015-11-02 ENCOUNTER — Ambulatory Visit (INDEPENDENT_AMBULATORY_CARE_PROVIDER_SITE_OTHER): Payer: BC Managed Care – PPO | Admitting: Gynecology

## 2015-11-02 ENCOUNTER — Encounter: Payer: Self-pay | Admitting: Gynecology

## 2015-11-02 ENCOUNTER — Other Ambulatory Visit: Payer: Self-pay | Admitting: Gynecology

## 2015-11-02 ENCOUNTER — Ambulatory Visit (INDEPENDENT_AMBULATORY_CARE_PROVIDER_SITE_OTHER): Payer: BC Managed Care – PPO

## 2015-11-02 VITALS — BP 114/66

## 2015-11-02 DIAGNOSIS — R102 Pelvic and perineal pain: Secondary | ICD-10-CM

## 2015-11-02 DIAGNOSIS — N839 Noninflammatory disorder of ovary, fallopian tube and broad ligament, unspecified: Secondary | ICD-10-CM | POA: Diagnosis not present

## 2015-11-02 DIAGNOSIS — N838 Other noninflammatory disorders of ovary, fallopian tube and broad ligament: Secondary | ICD-10-CM

## 2015-11-02 DIAGNOSIS — N809 Endometriosis, unspecified: Secondary | ICD-10-CM | POA: Diagnosis not present

## 2015-11-02 NOTE — Progress Notes (Signed)
    Jamie Cameron 1966/03/22 [id number]        50 y.o.  G1P1 presents for follow up ultrasound. History of abdominopelvic pain that ultimately we discovered that she had renal lithiasis. She also had a hemorrhagic appearing left ovarian cyst 09/01/2015 measuring 31 mm mean with layering debris. She relates having seen the urologist several times and they're dealing with a left ureteral stone. She also notes though ongoing left pelvic pain that seems to be getting worse over time exacerbated with exercise and consistent deep dyspareunia on the left. She does have a past history of laparoscopy with diagnosis of endometriosis.  Past medical history,surgical history, problem list, medications, allergies, family history and social history were all reviewed and documented in the EPIC chart.  Directed ROS with pertinent positives and negatives documented in the history of present illness/assessment and plan.  Exam: Filed Vitals:   11/02/15 1459  BP: 114/66   General appearance:  Normal  Ultrasound shows uterus normal size and echotexture. Endometrial echo of 3.5 mm. Right ovary normal. Left ovary with 16 x 14 x 12 mm mass with internal low-level echoes. Negative color flow. Cul-de-sac negative. Prior hemorrhagic cyst not visualized  Assessment/Plan:  50 y.o. G1P1 with persistent left pelvic pain on and off spontaneously, exacerbated with exercise and intercourse. History of endometriosis. Left ovary with small area suggestive of endometrioma. Options for further management to include laparoscopy with removal of this area or left salpingo-oophorectomy versus observation reviewed. The patient feels that her discomfort warrants proceeding with surgery but at this point is finishing a graduate degree and wants to wait untill the education is over and follow up for surgery in the fall.  She will call when she is ready to proceed with laparoscopy and then we'll move toward scheduling the surgery and probably  a preoperative ultrasound to reassess this area.  Greater than 50% of my time was spent in direct face to face counseling and coordination of care with the patient.    Dara LordsFONTAINE,TIMOTHY P MD, 3:37 PM 11/02/2015

## 2015-11-02 NOTE — Patient Instructions (Addendum)
Follow up when you're ready to proceed with surgery

## 2015-11-11 ENCOUNTER — Other Ambulatory Visit (INDEPENDENT_AMBULATORY_CARE_PROVIDER_SITE_OTHER): Payer: BC Managed Care – PPO

## 2015-11-11 ENCOUNTER — Ambulatory Visit: Payer: BC Managed Care – PPO | Admitting: Family Medicine

## 2015-11-11 DIAGNOSIS — Z0000 Encounter for general adult medical examination without abnormal findings: Secondary | ICD-10-CM | POA: Diagnosis not present

## 2015-11-11 DIAGNOSIS — Z111 Encounter for screening for respiratory tuberculosis: Secondary | ICD-10-CM

## 2015-11-11 LAB — BASIC METABOLIC PANEL
BUN: 10 mg/dL (ref 6–23)
CHLORIDE: 103 meq/L (ref 96–112)
CO2: 29 meq/L (ref 19–32)
Calcium: 9.6 mg/dL (ref 8.4–10.5)
Creatinine, Ser: 0.64 mg/dL (ref 0.40–1.20)
GFR: 104.88 mL/min (ref >60.00)
Glucose, Bld: 78 mg/dL (ref 70–99)
POTASSIUM: 3.6 meq/L (ref 3.5–5.1)
Sodium: 140 mEq/L (ref 135–145)

## 2015-11-11 LAB — POC URINALSYSI DIPSTICK (AUTOMATED)
Bilirubin, UA: NEGATIVE
GLUCOSE UA: NEGATIVE
Ketones, UA: NEGATIVE
LEUKOCYTES UA: NEGATIVE
Nitrite, UA: NEGATIVE
PH UA: 5.5
Protein, UA: NEGATIVE
Spec Grav, UA: 1.02
UROBILINOGEN UA: 0.2

## 2015-11-11 LAB — CBC WITH DIFFERENTIAL/PLATELET
BASOS PCT: 0.7 % (ref 0.0–3.0)
Basophils Absolute: 0.1 10*3/uL (ref 0.0–0.1)
EOS PCT: 1 % (ref 0.0–5.0)
Eosinophils Absolute: 0.1 10*3/uL (ref 0.0–0.7)
HEMATOCRIT: 41.4 % (ref 36.0–46.0)
HEMOGLOBIN: 13.8 g/dL (ref 12.0–15.0)
LYMPHS PCT: 26.5 % (ref 12.0–46.0)
Lymphs Abs: 2.1 10*3/uL (ref 0.7–4.0)
MCHC: 33.3 g/dL (ref 30.0–36.0)
MCV: 92.2 fl (ref 78.0–100.0)
MONO ABS: 0.5 10*3/uL (ref 0.1–1.0)
Monocytes Relative: 6.7 % (ref 3.0–12.0)
Neutro Abs: 5.1 10*3/uL (ref 1.4–7.7)
Neutrophils Relative %: 65.1 % (ref 43.0–77.0)
Platelets: 323 10*3/uL (ref 150.0–400.0)
RBC: 4.49 Mil/uL (ref 3.87–5.11)
RDW: 13.3 % (ref 11.5–15.5)
WBC: 7.8 10*3/uL (ref 4.0–10.5)

## 2015-11-11 LAB — LIPID PANEL
CHOLESTEROL: 186 mg/dL (ref 0–200)
HDL: 59.4 mg/dL (ref >39.00)
LDL Cholesterol: 96 mg/dL (ref 0–99)
NonHDL: 126.26
TRIGLYCERIDES: 151 mg/dL — AB (ref 0.0–149.0)
Total CHOL/HDL Ratio: 3
VLDL: 30.2 mg/dL (ref 0.0–40.0)

## 2015-11-11 LAB — TSH: TSH: 0.61 u[IU]/mL (ref 0.35–4.50)

## 2015-11-11 LAB — HEPATIC FUNCTION PANEL
ALT: 15 U/L (ref 0–35)
AST: 17 U/L (ref 0–37)
Albumin: 4.7 g/dL (ref 3.5–5.2)
Alkaline Phosphatase: 53 U/L (ref 39–117)
Bilirubin, Direct: 0.1 mg/dL (ref 0.0–0.3)
TOTAL PROTEIN: 6.6 g/dL (ref 6.0–8.3)
Total Bilirubin: 0.6 mg/dL (ref 0.2–1.2)

## 2015-11-11 MED ORDER — TUBERCULIN PPD 5 UNIT/0.1ML ID SOLN
5.0000 [IU] | Freq: Once | INTRADERMAL | Status: DC
  Administered 2015-11-11: 5 [IU] via INTRADERMAL

## 2015-11-11 MED ORDER — TUBERCULIN PPD 5 UNIT/0.1ML ID SOLN: 5.0000 [IU] | Freq: Once | INTRADERMAL | Status: DC

## 2015-11-14 ENCOUNTER — Telehealth: Payer: Self-pay | Admitting: *Deleted

## 2015-11-14 LAB — TB SKIN TEST
INDURATION: 0 mm
TB Skin Test: NEGATIVE

## 2015-11-14 NOTE — Telephone Encounter (Signed)
Patient came into office today to get Tb skin test read. Tb test was negative. Patient also requested lab results of UA and CBC recently drawn. UA showed small trace of blood in urine. Patient stated she has a kidney stone she has been seeing a urologist for. Plans for physical with Dr. Caryl NeverBurchette, Friday June 16th to complete nursing form.

## 2015-11-18 ENCOUNTER — Encounter: Payer: BC Managed Care – PPO | Admitting: Family Medicine

## 2015-11-22 ENCOUNTER — Encounter: Payer: BC Managed Care – PPO | Admitting: Family Medicine

## 2015-11-22 DIAGNOSIS — N201 Calculus of ureter: Secondary | ICD-10-CM | POA: Diagnosis not present

## 2015-11-23 ENCOUNTER — Encounter: Payer: Self-pay | Admitting: Family Medicine

## 2015-11-23 ENCOUNTER — Encounter: Payer: BC Managed Care – PPO | Admitting: Family Medicine

## 2015-11-23 ENCOUNTER — Ambulatory Visit (INDEPENDENT_AMBULATORY_CARE_PROVIDER_SITE_OTHER): Payer: BC Managed Care – PPO | Admitting: Family Medicine

## 2015-11-23 VITALS — BP 128/78 | HR 101 | Temp 98.3°F | Ht 61.5 in | Wt 122.0 lb

## 2015-11-23 DIAGNOSIS — Z111 Encounter for screening for respiratory tuberculosis: Secondary | ICD-10-CM | POA: Diagnosis not present

## 2015-11-23 DIAGNOSIS — Z0000 Encounter for general adult medical examination without abnormal findings: Secondary | ICD-10-CM

## 2015-11-23 DIAGNOSIS — Z23 Encounter for immunization: Secondary | ICD-10-CM

## 2015-11-23 NOTE — Progress Notes (Signed)
Subjective:    Patient ID: Jamie Cameron, female    DOB: 01/09/1966, 50 y.o.   MRN: [id number]  HPI  Patient here for physical. Patient needs forms completed for nursing school. She has endometriosis and has had some recent issues with pelvic pain. She is looking at possible left ovariectomy per GYN . Otherwise, very healthy. She has mild intermittent asthma and is followed by allergist. She has multiple pollen allergies. She is allergic to latex.  Recent tetanus booster. She's had previous titers for MMR and varicella which were positive. No other immunizations needed at this time.  Past Medical History  Diagnosis Date  . Allergy   . Asthma   . Endometriosis   . Hypokalemia   . Migraine    Past Surgical History  Procedure Laterality Date  . Foot surgery    . Pelvic laparoscopy  1996    endometriosis  . Breast surgery  1999    Left benign lymph node  . Cholecystectomy      reports that she has never smoked. She has never used smokeless tobacco. She reports that she does not drink alcohol or use illicit drugs. family history includes Breast cancer in her maternal aunt, maternal aunt, and maternal grandmother; Cancer in her maternal grandmother; Diabetes in her maternal grandfather and mother; Heart attack in her mother; Hypertension in her father and mother. Allergies  Allergen Reactions  . Sulfacetamide Sodium Nausea And Vomiting  . Prednisone Nausea And Vomiting  . Latex Rash     Review of Systems  Constitutional: Negative for fever, activity change, appetite change, fatigue and unexpected weight change.  HENT: Negative for ear pain, hearing loss, sore throat and trouble swallowing.   Eyes: Negative for visual disturbance.  Respiratory: Negative for cough and shortness of breath.   Cardiovascular: Negative for chest pain and palpitations.  Gastrointestinal: Negative for abdominal pain, diarrhea, constipation and blood in stool.  Genitourinary: Negative for dysuria  and hematuria.  Musculoskeletal: Negative for myalgias, back pain and arthralgias.  Skin: Negative for rash.  Neurological: Negative for dizziness, syncope and headaches.  Hematological: Negative for adenopathy.  Psychiatric/Behavioral: Negative for confusion and dysphoric mood.       Objective:   Physical Exam  Constitutional: She is oriented to person, place, and time. She appears well-developed and well-nourished.  HENT:  Head: Normocephalic and atraumatic.  Eyes: EOM are normal. Pupils are equal, round, and reactive to light.  Neck: Normal range of motion. Neck supple. No thyromegaly present.  Cardiovascular: Normal rate, regular rhythm and normal heart sounds.   No murmur heard. Pulmonary/Chest: Breath sounds normal. No respiratory distress. She has no wheezes. She has no rales.  Abdominal: Soft. Bowel sounds are normal. She exhibits no distension and no mass. There is no tenderness. There is no rebound and no guarding.  Genitourinary:  Per gyn  Musculoskeletal: Normal range of motion. She exhibits no edema.  Lymphadenopathy:    She has no cervical adenopathy.  Neurological: She is alert and oriented to person, place, and time. She displays normal reflexes. No cranial nerve deficit.  Skin: No rash noted.  Psychiatric: She has a normal mood and affect. Her behavior is normal. Judgment and thought content normal.          Assessment & Plan:  Physical exam. Labs reviewed. No major concerns. She did have trace blood on urine dipstick but she recently passed kidney stone. No immunizations indicated at this time. Forms completed for nursing school.  Alinda Sierras  Allena Pietila MD [facility] at [facility]

## 2015-11-23 NOTE — Progress Notes (Signed)
Pre visit review using our clinic review tool, if applicable. No additional management support is needed unless otherwise documented below in the visit note. 

## 2016-02-28 ENCOUNTER — Ambulatory Visit (INDEPENDENT_AMBULATORY_CARE_PROVIDER_SITE_OTHER): Payer: BC Managed Care – PPO | Admitting: *Deleted

## 2016-02-28 DIAGNOSIS — Z23 Encounter for immunization: Secondary | ICD-10-CM | POA: Diagnosis not present

## 2016-04-03 ENCOUNTER — Encounter: Payer: Self-pay | Admitting: Adult Health

## 2016-04-03 ENCOUNTER — Ambulatory Visit (INDEPENDENT_AMBULATORY_CARE_PROVIDER_SITE_OTHER): Payer: BC Managed Care – PPO | Admitting: Adult Health

## 2016-04-03 VITALS — BP 120/80 | Temp 98.7°F | Ht 61.5 in | Wt 119.6 lb

## 2016-04-03 DIAGNOSIS — R059 Cough, unspecified: Secondary | ICD-10-CM

## 2016-04-03 DIAGNOSIS — R05 Cough: Secondary | ICD-10-CM | POA: Diagnosis not present

## 2016-04-03 MED ORDER — HYDROCOD POLST-CPM POLST ER 10-8 MG/5ML PO SUER: 5.0000 mL | Freq: Every evening | ORAL | 0 refills | Status: DC | PRN

## 2016-04-03 MED ORDER — BENZONATATE 200 MG PO CAPS: 200.0000 mg | ORAL_CAPSULE | Freq: Two times a day (BID) | ORAL | 0 refills | Status: DC | PRN

## 2016-04-03 NOTE — Patient Instructions (Signed)
It was great meeting you today!  Your exam appears to be more of a viral illness. Antibiotics are not warranted at this time.  I have sent in a prescription for Tessalon Pearls.   Continue to use Mucinex and drink a lot of water.

## 2016-04-03 NOTE — Progress Notes (Signed)
   Subjective:    Patient ID: Jamie Cameron, female    DOB: 1966-01-22, 50 y.o.   MRN: [id number]  URI   This is a new problem. The current episode started 1 to 4 weeks ago (9 days ). The maximum temperature recorded prior to her arrival was 100.4 - 100.9 F. The fever has been present for 3 to 4 days. Associated symptoms include congestion, nausea, a sore throat and wheezing. Pertinent negatives include no ear pain, rhinorrhea, sinus pain or sneezing. She has tried inhaler use and decongestant for the symptoms. The treatment provided no relief.    Review of Systems  Constitutional: Positive for fatigue and fever.  HENT: Positive for congestion, sore throat and voice change. Negative for ear discharge, ear pain, postnasal drip, rhinorrhea, sinus pressure, sneezing, tinnitus and trouble swallowing.   Eyes: Negative.   Respiratory: Positive for wheezing.   Gastrointestinal: Positive for nausea.  Genitourinary: Negative.   Musculoskeletal: Negative.   Skin: Negative.   Neurological: Negative.   Psychiatric/Behavioral: Positive for sleep disturbance.       Objective:   Physical Exam  Constitutional: She is oriented to person, place, and time. She appears well-developed and well-nourished. No distress.  HENT:  Head: Normocephalic and atraumatic.  Right Ear: External ear normal.  Left Ear: External ear normal.  Nose: Nose normal.  Mouth/Throat: Oropharynx is clear and moist. No oropharyngeal exudate.  Eyes: Conjunctivae and EOM are normal. Pupils are equal, round, and reactive to light. Right eye exhibits no discharge. Left eye exhibits no discharge. No scleral icterus.  Neck: Normal range of motion. Neck supple. No thyromegaly present.  Cardiovascular: Normal rate, regular rhythm, normal heart sounds and intact distal pulses.  Exam reveals no gallop and no friction rub.   No murmur heard. Pulmonary/Chest: Effort normal. No respiratory distress. She has wheezes in the right upper field  and the left upper field. She has no rales. She exhibits no tenderness.  Lymphadenopathy:    She has no cervical adenopathy.  Neurological: She is alert and oriented to person, place, and time.  Skin: Skin is warm and dry. No rash noted. She is not diaphoretic. No erythema. No pallor.  Psychiatric: She has a normal mood and affect. Her behavior is normal. Judgment and thought content normal.  Nursing note and vitals reviewed.     Assessment & Plan:  1. Cough - Does not appear to be bacterial in nature. Will treat with Tessalon pearls and Tussionex. She is allergic to prednisone..  - benzonatate (TESSALON) 200 MG capsule; Take 1 capsule (200 mg total) by mouth 2 (two) times daily as needed for cough.  Dispense: 20 capsule; Refill: 0 - chlorpheniramine-HYDROcodone (TUSSIONEX PENNKINETIC ER) 10-8 MG/5ML SUER; Take 5 mLs by mouth at bedtime as needed for cough.  Dispense: 140 mL; Refill: 0 - Follow up with PCP if no improvement  Shirline Freesory Norvell Caswell, NP

## 2016-07-23 ENCOUNTER — Ambulatory Visit (INDEPENDENT_AMBULATORY_CARE_PROVIDER_SITE_OTHER): Payer: BLUE CROSS/BLUE SHIELD | Admitting: Family Medicine

## 2016-07-23 DIAGNOSIS — R05 Cough: Secondary | ICD-10-CM

## 2016-07-23 DIAGNOSIS — R059 Cough, unspecified: Secondary | ICD-10-CM

## 2016-07-23 MED ORDER — HYDROCOD POLST-CPM POLST ER 10-8 MG/5ML PO SUER: 5.0000 mL | Freq: Every evening | ORAL | 0 refills | Status: DC | PRN

## 2016-07-23 NOTE — Progress Notes (Signed)
Subjective:     Patient ID: Jamie Cameron, female   DOB: 01/05/1966, 51 y.o.   MRN: [id number]  HPI Patient seen with onset over the weekend of illness. She had some vomiting and diarrhea which is now resolved. She mostly is dry cough at this time. She had some mild bodyaches over the weekend and possible low-grade fever but none for the past day and a half. Her cough has been severe at night. Nonproductive. Not relieved with NyQuil. Nonsmoker. No recent wheezing. No dyspnea  Past Medical History:  Diagnosis Date  . Allergy   . Asthma   . Endometriosis   . Hypokalemia   . Migraine    Past Surgical History:  Procedure Laterality Date  . BREAST SURGERY  1999   Left benign lymph node  . CHOLECYSTECTOMY    . FOOT SURGERY    . PELVIC LAPAROSCOPY  1996   endometriosis    reports that she has never smoked. She has never used smokeless tobacco. She reports that she does not drink alcohol or use drugs. family history includes Breast cancer in her maternal aunt, maternal aunt, and maternal grandmother; Cancer in her maternal grandmother; Diabetes in her maternal grandfather and mother; Heart attack in her mother; Hypertension in her father and mother. Allergies  Allergen Reactions  . Sulfacetamide Sodium Nausea And Vomiting  . Prednisone Nausea And Vomiting  . Latex Rash     Review of Systems  Constitutional: Positive for fatigue. Negative for chills and fever.  Respiratory: Positive for cough.   Cardiovascular: Negative for chest pain.       Objective:   Physical Exam  Constitutional: She appears well-developed and well-nourished. No distress.  HENT:  Right Ear: External ear normal.  Left Ear: External ear normal.  Mouth/Throat: Oropharynx is clear and moist.  Neck: Neck supple.  Cardiovascular: Normal rate and regular rhythm.   Pulmonary/Chest: Effort normal and breath sounds normal. No respiratory distress. She has no wheezes. She has no rales.  Lymphadenopathy:    She  has no cervical adenopathy.       Assessment:     Cough. Suspect acute viral illness. Nonfocal exam    Plan:     -Tussionex 1 teaspoon daily at bedtime for severe cough Follow-up for any fever or worsening symptoms  Kristian CoveyBruce W Dub Maclellan MD [facility] -

## 2016-07-23 NOTE — Patient Instructions (Signed)

## 2016-07-23 NOTE — Progress Notes (Signed)
Pre visit review using our clinic review tool, if applicable. No additional management support is needed unless otherwise documented below in the visit note. 

## 2016-07-27 ENCOUNTER — Telehealth: Payer: Self-pay | Admitting: Family Medicine

## 2016-07-27 ENCOUNTER — Other Ambulatory Visit: Payer: Self-pay

## 2016-07-27 MED ORDER — AZITHROMYCIN 250 MG PO TABS: ORAL_TABLET | ORAL | 0 refills | Status: DC

## 2016-07-27 NOTE — Telephone Encounter (Signed)
Pt state that she is not feeling any better from Monday throat sore and severe cough.  [facility] [place] [facility] [place]

## 2016-07-27 NOTE — Telephone Encounter (Signed)
Spoke with pt.  She has had progressive sinus pain and pressure and feels worse overall.  Send in Zithromax (Z-pack) use as directed.

## 2016-07-30 ENCOUNTER — Encounter: Payer: Self-pay | Admitting: Family Medicine

## 2016-07-30 ENCOUNTER — Ambulatory Visit (INDEPENDENT_AMBULATORY_CARE_PROVIDER_SITE_OTHER): Payer: BLUE CROSS/BLUE SHIELD | Admitting: Family Medicine

## 2016-07-30 VITALS — BP 104/80 | HR 93 | Temp 98.3°F | Ht 61.5 in | Wt 123.7 lb

## 2016-07-30 DIAGNOSIS — R059 Cough, unspecified: Secondary | ICD-10-CM

## 2016-07-30 DIAGNOSIS — R05 Cough: Secondary | ICD-10-CM | POA: Diagnosis not present

## 2016-07-30 MED ORDER — LEVOFLOXACIN 500 MG PO TABS: 500.0000 mg | ORAL_TABLET | Freq: Every day | ORAL | 0 refills | Status: DC

## 2016-07-30 NOTE — Patient Instructions (Signed)
Follow up for any recurrent fever or increased shortness of breath. 

## 2016-07-30 NOTE — Progress Notes (Signed)
Pre visit review using our clinic review tool, if applicable. No additional management support is needed unless otherwise documented below in the visit note. 

## 2016-07-30 NOTE — Progress Notes (Signed)
Subjective:     Patient ID: Jamie Cameron, female   DOB: 1966/03/06, 51 y.o.   MRN: [id number]  HPI Patient seen with recent respiratory illness. We suspected viral illness. She called back a few days later and we ended up calling in Zithromax. She started this Friday. By Saturday she had temperature of 100.5. She was not aware of any fever yesterday. She has cough which is mostly nonproductive. She feels more run down. She has some tender lymph nodes mostly posterior cervical triangle. She has some persistent sore throat. No exudate. She is using her inhalers regularly.  Past Medical History:  Diagnosis Date  . Allergy   . Asthma   . Endometriosis   . Hypokalemia   . Migraine    Past Surgical History:  Procedure Laterality Date  . BREAST SURGERY  1999   Left benign lymph node  . CHOLECYSTECTOMY    . FOOT SURGERY    . PELVIC LAPAROSCOPY  1996   endometriosis    reports that she has never smoked. She has never used smokeless tobacco. She reports that she does not drink alcohol or use drugs. family history includes Breast cancer in her maternal aunt, maternal aunt, and maternal grandmother; Cancer in her maternal grandmother; Diabetes in her maternal grandfather and mother; Heart attack in her mother; Hypertension in her father and mother. Allergies  Allergen Reactions  . Sulfacetamide Sodium Nausea And Vomiting  . Prednisone Nausea And Vomiting  . Latex Rash     Review of Systems  Constitutional: Positive for fatigue. Negative for chills.  HENT: Positive for sore throat.   Respiratory: Positive for cough.        Objective:   Physical Exam  Constitutional: She appears well-developed and well-nourished.  HENT:  Right Ear: External ear normal.  Left Ear: External ear normal.  Mouth/Throat: Oropharynx is clear and moist.  Neck: Neck supple.  She has some tender posterior cervical nodes  Cardiovascular: Normal rate and regular rhythm.   Pulmonary/Chest: Effort normal and  breath sounds normal. No respiratory distress. She has no wheezes. She has no rales.  Skin: No rash noted.       Assessment:     Persistent cough. Although differential still viral given, the fact she had some fever several days and illness Saturday we elected to go and broaden coverage to Levaquin    Plan:     -Levaquin 500 milligrams once daily for 10 days -Follow-up immediately for any shortness of breath or persistent fever  Kristian CoveyBruce W Hitesh Fouche MD [facility]

## 2016-08-02 ENCOUNTER — Encounter: Payer: Self-pay | Admitting: Gynecology

## 2016-08-02 ENCOUNTER — Ambulatory Visit (INDEPENDENT_AMBULATORY_CARE_PROVIDER_SITE_OTHER): Payer: BLUE CROSS/BLUE SHIELD | Admitting: Gynecology

## 2016-08-02 VITALS — BP 120/74 | Ht 62.0 in | Wt 124.0 lb

## 2016-08-02 DIAGNOSIS — Z13220 Encounter for screening for lipoid disorders: Secondary | ICD-10-CM | POA: Diagnosis not present

## 2016-08-02 DIAGNOSIS — Z01419 Encounter for gynecological examination (general) (routine) without abnormal findings: Secondary | ICD-10-CM

## 2016-08-02 LAB — LIPID PANEL
Cholesterol: 157 mg/dL (ref <200)
HDL: 45 mg/dL — ABNORMAL LOW (ref >50)
LDL CALC: 83 mg/dL (ref <100)
TRIGLYCERIDES: 145 mg/dL (ref <150)
Total CHOL/HDL Ratio: 3.5 Ratio (ref <5.0)
VLDL: 29 mg/dL (ref <30)

## 2016-08-02 LAB — COMPREHENSIVE METABOLIC PANEL
ALBUMIN: 3.8 g/dL (ref 3.6–5.1)
ALT: 11 U/L (ref 6–29)
AST: 16 U/L (ref 10–35)
Alkaline Phosphatase: 56 U/L (ref 33–115)
BILIRUBIN TOTAL: 0.2 mg/dL (ref 0.2–1.2)
BUN: 10 mg/dL (ref 7–25)
CHLORIDE: 101 mmol/L (ref 98–110)
CO2: 25 mmol/L (ref 20–31)
CREATININE: 0.62 mg/dL (ref 0.50–1.10)
Calcium: 9 mg/dL (ref 8.6–10.2)
GLUCOSE: 78 mg/dL (ref 65–99)
Potassium: 4 mmol/L (ref 3.5–5.3)
SODIUM: 141 mmol/L (ref 135–146)
Total Protein: 6.1 g/dL (ref 6.1–8.1)

## 2016-08-02 LAB — CBC WITH DIFFERENTIAL/PLATELET
Basophils Absolute: 0 cells/uL (ref 0–200)
Basophils Relative: 0 %
EOS PCT: 1 %
Eosinophils Absolute: 73 cells/uL (ref 15–500)
HCT: 40.1 % (ref 35.0–45.0)
HEMOGLOBIN: 13.2 g/dL (ref 11.7–15.5)
LYMPHS ABS: 1533 {cells}/uL (ref 850–3900)
Lymphocytes Relative: 21 %
MCH: 30.1 pg (ref 27.0–33.0)
MCHC: 32.9 g/dL (ref 32.0–36.0)
MCV: 91.6 fL (ref 80.0–100.0)
MONOS PCT: 9 %
MPV: 10.7 fL (ref 7.5–12.5)
Monocytes Absolute: 657 cells/uL (ref 200–950)
NEUTROS ABS: 5037 {cells}/uL (ref 1500–7800)
NEUTROS PCT: 69 %
PLATELETS: 420 10*3/uL — AB (ref 140–400)
RBC: 4.38 MIL/uL (ref 3.80–5.10)
RDW: 13.1 % (ref 11.0–15.0)
WBC: 7.3 10*3/uL (ref 3.8–10.8)

## 2016-08-02 LAB — URINALYSIS W MICROSCOPIC + REFLEX CULTURE
Bacteria, UA: NONE SEEN [HPF]
Bilirubin Urine: NEGATIVE
Casts: NONE SEEN [LPF]
Crystals: NONE SEEN [HPF]
GLUCOSE, UA: NEGATIVE
Hgb urine dipstick: NEGATIVE
LEUKOCYTES UA: NEGATIVE
NITRITE: NEGATIVE
PH: 7 (ref 5.0–8.0)
Protein, ur: NEGATIVE
RBC / HPF: NONE SEEN RBC/HPF (ref <=2)
SPECIFIC GRAVITY, URINE: 1.015 (ref 1.001–1.035)
WBC UA: NONE SEEN WBC/HPF (ref <=5)
YEAST: NONE SEEN [HPF]

## 2016-08-02 MED ORDER — SUMATRIPTAN SUCCINATE 50 MG PO TABS: 50.0000 mg | ORAL_TABLET | Freq: Once | ORAL | 3 refills | Status: DC

## 2016-08-02 NOTE — Patient Instructions (Signed)

## 2016-08-02 NOTE — Progress Notes (Signed)
    Jamie Cameron March 24, 1966 [id number]        51 y.o.  G1P1 for annual exam.  Continues to have some on and off left pelvic pain as discussed below.  Past medical history,surgical history, problem list, medications, allergies, family history and social history were all reviewed and documented as reviewed in the EPIC chart.  ROS:  Performed with pertinent positives and negatives included in the history, assessment and plan.   Additional significant findings :  None   Exam: Kennon PortelaKim Gardner assistant Vitals:   08/02/16 1144  BP: 120/74  Weight: 124 lb (56.2 kg)  Height: 5\' 2"  (1.575 m)   Body mass index is 22.68 kg/m.  General appearance:  Normal affect, orientation and appearance. Skin: Grossly normal HEENT: Without gross lesions.  No cervical or supraclavicular adenopathy. Thyroid normal.  Lungs:  Clear without wheezing, rales or rhonchi Cardiac: RR, without RMG Abdominal:  Soft, nontender, without masses, guarding, rebound, organomegaly or hernia Breasts:  Examined lying and sitting without masses, retractions, discharge or axillary adenopathy. Pelvic:  Ext, BUS, Vagina: Normal  Cervix: Normal  Uterus: Anteverted, normal size, shape and contour, midline and mobile nontender   Adnexa: Without masses or tenderness    Anus and perineum: Normal   Rectovaginal: Normal sphincter tone without palpated masses or tenderness.    Assessment/Plan:  51 y.o. G1P1 female for annual exam with regular menses, abstinent birth control.   1. History of small left ovarian cystic change suggestive of endometrioma. History of endometriosis in the past. Continues to have some pain on and off. Had thought about surgery but at this point is not interested in prefers just to monitor. Pain is not worsening. Her exam is normal and she is thin and easy to examine. At this point we'll follow expectantly and if she decides she wants to pursue surgery she'll represent for evaluation. 2. Mammography 08/2015.  Continue with annual mammography coming due. SBE monthly reviewed. 3. Pap smear 2016. No Pap smear done today. No history of significant abnormal Pap smears previously. Plan repeat Pap smear next year at 3 year interval. 4. Health maintenance. Baseline CBC, CMP, lipid profile, urinalysis ordered. Follow up 1 year, sooner as needed.    Jamie Cameron,Sharley Keeler P MD, 12:11 PM 08/02/2016

## 2016-08-10 ENCOUNTER — Telehealth: Payer: Self-pay | Admitting: Family Medicine

## 2016-08-10 NOTE — Telephone Encounter (Signed)
Pt need to have a documentation stating the reason for her taking levaquin and TUSSIONEX it is for work and would like to pick it up no later than 3/12 or 3/13.

## 2016-08-12 ENCOUNTER — Encounter: Payer: Self-pay | Admitting: Family Medicine

## 2016-08-12 NOTE — Progress Notes (Signed)
Letter done

## 2016-08-12 NOTE — Telephone Encounter (Signed)
Letter done

## 2016-08-13 NOTE — Telephone Encounter (Signed)
Patient is aware 

## 2017-01-02 DIAGNOSIS — L309 Dermatitis, unspecified: Secondary | ICD-10-CM | POA: Diagnosis not present

## 2017-01-02 DIAGNOSIS — L700 Acne vulgaris: Secondary | ICD-10-CM | POA: Diagnosis not present

## 2017-03-26 ENCOUNTER — Ambulatory Visit (INDEPENDENT_AMBULATORY_CARE_PROVIDER_SITE_OTHER): Payer: 59 | Admitting: Family Medicine

## 2017-03-26 ENCOUNTER — Encounter: Payer: Self-pay | Admitting: Family Medicine

## 2017-03-26 VITALS — BP 102/78 | HR 102 | Temp 98.7°F | Wt 128.9 lb

## 2017-03-26 DIAGNOSIS — R509 Fever, unspecified: Secondary | ICD-10-CM

## 2017-03-26 DIAGNOSIS — R52 Pain, unspecified: Secondary | ICD-10-CM

## 2017-03-26 LAB — POCT INFLUENZA A/B
Influenza A, POC: NEGATIVE
Influenza B, POC: NEGATIVE

## 2017-03-26 MED ORDER — SUMATRIPTAN SUCCINATE 100 MG PO TABS: 100.0000 mg | ORAL_TABLET | ORAL | 3 refills | Status: DC | PRN

## 2017-03-26 MED ORDER — ONDANSETRON 8 MG PO TBDP: 8.0000 mg | ORAL_TABLET | Freq: Three times a day (TID) | ORAL | 0 refills | Status: DC | PRN

## 2017-03-26 NOTE — Patient Instructions (Addendum)
Viral Illness, Adult Viruses are tiny germs that can get into a person's body and cause illness. There are many different types of viruses, and they cause many types of illness. Viral illnesses can range from mild to severe. They can affect various parts of the body. Common illnesses that are caused by a virus include colds and the flu. Viral illnesses also include serious conditions such as HIV/AIDS (human immunodeficiency virus/acquired immunodeficiency syndrome). A few viruses have been linked to certain cancers. What are the causes? Many types of viruses can cause illness. Viruses invade cells in your body, multiply, and cause the infected cells to malfunction or die. When the cell dies, it releases more of the virus. When this happens, you develop symptoms of the illness, and the virus continues to spread to other cells. If the virus takes over the function of the cell, it can cause the cell to divide and grow out of control, as is the case when a virus causes cancer. Different viruses get into the body in different ways. You can get a virus by:  Swallowing food or water that is contaminated with the virus.  Breathing in droplets that have been coughed or sneezed into the air by an infected person.  Touching a surface that has been contaminated with the virus and then touching your eyes, nose, or mouth.  Being bitten by an insect or animal that carries the virus.  Having sexual contact with a person who is infected with the virus.  Being exposed to blood or fluids that contain the virus, either through an open cut or during a transfusion.  If a virus enters your body, your body's defense system (immune system) will try to fight the virus. You may be at higher risk for a viral illness if your immune system is weak. What are the signs or symptoms? Symptoms vary depending on the type of virus and the location of the cells that it invades. Common symptoms of the main types of viral illnesses  include: Cold and flu viruses  Fever.  Headache.  Sore throat.  Muscle aches.  Nasal congestion.  Cough. Digestive system (gastrointestinal) viruses  Fever.  Abdominal pain.  Nausea.  Diarrhea. Liver viruses (hepatitis)  Loss of appetite.  Tiredness.  Yellowing of the skin (jaundice). Brain and spinal cord viruses  Fever.  Headache.  Stiff neck.  Nausea and vomiting.  Confusion or sleepiness. Skin viruses  Warts.  Itching.  Rash. Sexually transmitted viruses  Discharge.  Swelling.  Redness.  Rash. How is this treated? Viruses can be difficult to treat because they live within cells. Antibiotic medicines do not treat viruses because these drugs do not get inside cells. Treatment for a viral illness may include:  Resting and drinking plenty of fluids.  Medicines to relieve symptoms. These can include over-the-counter medicine for pain and fever, medicines for cough or congestion, and medicines to relieve diarrhea.  Antiviral medicines. These drugs are available only for certain types of viruses. They may help reduce flu symptoms if taken early. There are also many antiviral medicines for hepatitis and HIV/AIDS.  Some viral illnesses can be prevented with vaccinations. A common example is the flu shot. Follow these instructions at home: Medicines   Take over-the-counter and prescription medicines only as told by your health care provider.  If you were prescribed an antiviral medicine, take it as told by your health care provider. Do not stop taking the medicine even if you start to feel better.  Be aware   of when antibiotics are needed and when they are not needed. Antibiotics do not treat viruses. If your health care provider thinks that you may have a bacterial infection as well as a viral infection, you may get an antibiotic. ? Do not ask for an antibiotic prescription if you have been diagnosed with a viral illness. That will not make your  illness go away faster. ? Frequently taking antibiotics when they are not needed can lead to antibiotic resistance. When this develops, the medicine no longer works against the bacteria that it normally fights. General instructions  Drink enough fluids to keep your urine clear or pale yellow.  Rest as much as possible.  Return to your normal activities as told by your health care provider. Ask your health care provider what activities are safe for you.  Keep all follow-up visits as told by your health care provider. This is important. How is this prevented? Take these actions to reduce your risk of viral infection:  Eat a healthy diet and get enough rest.  Wash your hands often with soap and water. This is especially important when you are in public places. If soap and water are not available, use hand sanitizer.  Avoid close contact with friends and family who have a viral illness.  If you travel to areas where viral gastrointestinal infection is common, avoid drinking water or eating raw food.  Keep your immunizations up to date. Get a flu shot every year as told by your health care provider.  Do not share toothbrushes, nail clippers, razors, or needles with other people.  Always practice safe sex.  Contact a health care provider if:  You have symptoms of a viral illness that do not go away.  Your symptoms come back after going away.  Your symptoms get worse. Get help right away if:  You have trouble breathing.  You have a severe headache or a stiff neck.  You have severe vomiting or abdominal pain. This information is not intended to replace advice given to you by your health care provider. Make sure you discuss any questions you have with your health care provider. Document Released: 09/30/2015 Document Revised: 11/02/2015 Document Reviewed: 09/30/2015 Elsevier Interactive Patient Education  2018 Elsevier Inc.  

## 2017-03-26 NOTE — Progress Notes (Signed)
Subjective:     Patient ID: Jamie Cameron, female   DOB: September 08, 1965, 51 y.o.   MRN: [id number]  HPI Patient seen with acute illness. Onset this past Saturday. Onset then of body aches, fever, chills, increased malaise. She's had some intermittent nausea or vomiting. No diarrhea. No skin rash. Intermittent headache. No dysuria. No localizing abdominal pain. Mild nasal congestion.  She works as a Engineer, civil (consulting)nurse in cardiac care and was most were today and Thursday and is concerned about exposing patients. She is are had flu vaccine.  Past Medical History:  Diagnosis Date  . Allergy   . Asthma   . Endometriosis   . Hypokalemia   . Migraine    Past Surgical History:  Procedure Laterality Date  . BREAST SURGERY  1999   Left benign lymph node  . CHOLECYSTECTOMY    . FOOT SURGERY    . PELVIC LAPAROSCOPY  1996   endometriosis    reports that she has never smoked. She has never used smokeless tobacco. She reports that she does not drink alcohol or use drugs. family history includes Breast cancer in her maternal aunt, maternal aunt, and maternal grandmother; Cancer in her maternal grandmother; Diabetes in her maternal grandfather and mother; Heart attack in her mother; Hypertension in her father and mother. Allergies  Allergen Reactions  . Sulfacetamide Sodium Nausea And Vomiting  . Prednisone Nausea And Vomiting  . Latex Rash     Review of Systems  Constitutional: Positive for chills, fatigue and fever.  HENT: Positive for congestion and sore throat.   Respiratory: Positive for cough. Negative for shortness of breath and wheezing.   Cardiovascular: Negative for chest pain.  Gastrointestinal: Positive for nausea. Negative for blood in stool and diarrhea.  Neurological: Positive for headaches.       Objective:   Physical Exam  Constitutional: She appears well-developed and well-nourished.  HENT:  Right Ear: External ear normal.  Left Ear: External ear normal.  Nose: Nose normal.   Mouth/Throat: Oropharynx is clear and moist.  Neck: Neck supple.  Cardiovascular: Normal rate and regular rhythm.   Pulmonary/Chest: Effort normal and breath sounds normal. No respiratory distress. She has no wheezes. She has no rales.  Lymphadenopathy:    She has no cervical adenopathy.  Skin: No rash noted.       Assessment:     Probable acute viral syndrome. Influenza screen negative    Plan:     -Symptomatic treatment with rest, fluids, ibuprofen or Tylenol for body aches -Work note written to be out through Friday. -Follow-up for any persistent or worsening symptoms  Kristian CoveyBruce W Burchette MD [facility]

## 2017-04-02 ENCOUNTER — Ambulatory Visit (INDEPENDENT_AMBULATORY_CARE_PROVIDER_SITE_OTHER)
Admission: RE | Admit: 2017-04-02 | Discharge: 2017-04-02 | Disposition: A | Discharge status: Home / Self Care | Payer: 59 | Source: Ambulatory Visit | Attending: Internal Medicine | Admitting: Internal Medicine

## 2017-04-02 ENCOUNTER — Other Ambulatory Visit: Payer: Self-pay | Admitting: Internal Medicine

## 2017-04-02 ENCOUNTER — Ambulatory Visit (INDEPENDENT_AMBULATORY_CARE_PROVIDER_SITE_OTHER): Payer: 59 | Admitting: Internal Medicine

## 2017-04-02 ENCOUNTER — Encounter: Payer: Self-pay | Admitting: Internal Medicine

## 2017-04-02 VITALS — BP 122/84 | HR 93 | Temp 97.7°F | Wt 130.4 lb

## 2017-04-02 DIAGNOSIS — R1084 Generalized abdominal pain: Secondary | ICD-10-CM

## 2017-04-02 DIAGNOSIS — R1110 Vomiting, unspecified: Secondary | ICD-10-CM | POA: Diagnosis not present

## 2017-04-02 DIAGNOSIS — R509 Fever, unspecified: Secondary | ICD-10-CM | POA: Diagnosis not present

## 2017-04-02 DIAGNOSIS — R945 Abnormal results of liver function studies: Secondary | ICD-10-CM

## 2017-04-02 DIAGNOSIS — N83202 Unspecified ovarian cyst, left side: Secondary | ICD-10-CM | POA: Diagnosis not present

## 2017-04-02 DIAGNOSIS — E876 Hypokalemia: Secondary | ICD-10-CM

## 2017-04-02 DIAGNOSIS — R1031 Right lower quadrant pain: Secondary | ICD-10-CM

## 2017-04-02 DIAGNOSIS — R112 Nausea with vomiting, unspecified: Secondary | ICD-10-CM | POA: Diagnosis not present

## 2017-04-02 DIAGNOSIS — R7989 Other specified abnormal findings of blood chemistry: Secondary | ICD-10-CM

## 2017-04-02 LAB — HEPATIC FUNCTION PANEL
ALK PHOS: 112 U/L (ref 39–117)
ALT: 79 U/L — ABNORMAL HIGH (ref 0–35)
AST: 67 U/L — ABNORMAL HIGH (ref 0–37)
Albumin: 3.6 g/dL (ref 3.5–5.2)
BILIRUBIN DIRECT: 0.1 mg/dL (ref 0.0–0.3)
BILIRUBIN TOTAL: 0.4 mg/dL (ref 0.2–1.2)
Total Protein: 5.5 g/dL — ABNORMAL LOW (ref 6.0–8.3)

## 2017-04-02 LAB — POC URINALSYSI DIPSTICK (AUTOMATED)
Bilirubin, UA: NEGATIVE
Blood, UA: NEGATIVE
GLUCOSE UA: NEGATIVE
KETONES UA: NEGATIVE
Leukocytes, UA: NEGATIVE
Nitrite, UA: NEGATIVE
PROTEIN UA: NEGATIVE
Urobilinogen, UA: 0.2 E.U./dL
pH, UA: 6 (ref 5.0–8.0)

## 2017-04-02 LAB — CBC WITH DIFFERENTIAL/PLATELET
BASOS PCT: 0.8 % (ref 0.0–3.0)
Basophils Absolute: 0 10*3/uL (ref 0.0–0.1)
EOS PCT: 0.3 % (ref 0.0–5.0)
Eosinophils Absolute: 0 10*3/uL (ref 0.0–0.7)
HCT: 40.4 % (ref 36.0–46.0)
HEMOGLOBIN: 13.5 g/dL (ref 12.0–15.0)
LYMPHS ABS: 2.6 10*3/uL (ref 0.7–4.0)
Lymphocytes Relative: 45.7 % (ref 12.0–46.0)
MCHC: 33.4 g/dL (ref 30.0–36.0)
MCV: 92 fl (ref 78.0–100.0)
MONO ABS: 0.5 10*3/uL (ref 0.1–1.0)
Monocytes Relative: 8.7 % (ref 3.0–12.0)
NEUTROS ABS: 2.6 10*3/uL (ref 1.4–7.7)
Neutrophils Relative %: 44.5 % (ref 43.0–77.0)
PLATELETS: 192 10*3/uL (ref 150.0–400.0)
RBC: 4.39 Mil/uL (ref 3.87–5.11)
RDW: 13.4 % (ref 11.5–15.5)
WBC: 5.8 10*3/uL (ref 4.0–10.5)

## 2017-04-02 LAB — BASIC METABOLIC PANEL
BUN: 7 mg/dL (ref 6–23)
CHLORIDE: 101 meq/L (ref 96–112)
CO2: 31 meq/L (ref 19–32)
Calcium: 8.9 mg/dL (ref 8.4–10.5)
Creatinine, Ser: 0.53 mg/dL (ref 0.40–1.20)
GFR: 129.63 mL/min (ref >60.00)
GLUCOSE: 94 mg/dL (ref 70–99)
POTASSIUM: 3.1 meq/L — AB (ref 3.5–5.1)
Sodium: 139 mEq/L (ref 135–145)

## 2017-04-02 LAB — C-REACTIVE PROTEIN: CRP: 0.8 mg/dL (ref 0.5–20.0)

## 2017-04-02 LAB — POCT URINE PREGNANCY: Preg Test, Ur: NEGATIVE

## 2017-04-02 MED ORDER — IOPAMIDOL (ISOVUE-300) INJECTION 61%
100.0000 mL | Freq: Once | INTRAVENOUS | Status: AC | PRN
  Administered 2017-04-02: 100 mL via INTRAVENOUS

## 2017-04-02 NOTE — Progress Notes (Signed)
Discussed lab and CT results with patient   she will come in tomorrow at 2:00 to get further lab work blood work to evaluate her fever abdominal pain and right-sided abdominal pain. 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

## 2017-04-02 NOTE — Progress Notes (Signed)
Chief Complaint  Patient presents with  . Fever    Ongoing fever x 1 week. Fever last night 101.6, chills, headache and bloated feeling in abdomen. Abdomen is tender to the touch and pain in shoulder blades. Took Tylenol this morning.     HPI: Jamie Cameron 51 y.o.  PCP NA seen last week for fever vomiting minor respiratory symptoms that was felt to be viral.  However she has had continued fever at 900 102 range.  And his having persistent progressive abdominal bloating and pain without current vomiting or diarrhea.  Feels that her bowel sounds are gurgling a lot. No major cough or runny nose sore throat at this time.  Pain abd and back with fever and headaches   101.8  And d  Last nighjt 102 last night.  No uti sx.    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  Is generally well. Has her regular GYN checkups no problems.  No rash no mbites.  ROS: See pertinent positives and negatives per HPI.  She has been on medicine low-dose twice a day for about 3 weeks from her dermatologist.  Past Medical History:  Diagnosis Date  . Allergy   . Asthma   . Endometriosis   . Hypokalemia   . Migraine     Family History  Problem Relation Age of Onset  . Hypertension Mother   . Diabetes Mother   . Heart attack Mother   . Hypertension Father   . Breast cancer Maternal Aunt        50's  . Breast cancer Maternal Aunt        60's  . Cancer Maternal Grandmother        cervical  . Breast cancer Maternal Grandmother        70's  . Diabetes Maternal Grandfather     Social History   Social History  . Marital status: Married    Spouse name: N/A  . Number of children: N/A  . Years of education: N/A   Social History Main Topics  . Smoking status: Never Smoker  . Smokeless tobacco: Never Used  . Alcohol use No  . Drug use: No  . Sexual activity: Not Currently    Birth control/ protection: Other-see comments   Comment: vasectomy-1st intercourse 51 yo-Fewer than 5 partners   Other Topics Concern  . None   Social History Narrative  . None    Outpatient Medications Prior to Visit  Medication Sig Dispense Refill  . Calcium Carbonate-Vitamin D (CALCIUM + D PO) Take by mouth.      . chlorpheniramine-HYDROcodone (TUSSIONEX PENNKINETIC ER) 10-8 MG/5ML SUER Take 5 mLs by mouth at bedtime as needed for cough. 120 mL 0  . ibuprofen (ADVIL,MOTRIN) 800 MG tablet Take 1 tablet (800 mg total) by mouth every 8 (eight) hours as needed. 30 tablet 1  . minocycline (DYNACIN) 50 MG tablet Take 50 mg by mouth 2 (two) times daily.    Marland Kitchen NASONEX 50 MCG/ACT nasal spray as needed.    . NON FORMULARY TRILUMA.  1 application to face daily.    . ondansetron (ZOFRAN ODT) 8 MG disintegrating tablet Take 1 tablet (8 mg total) by mouth every 8 (eight) hours as needed for nausea or vomiting. 20 tablet 0  . PROAIR HFA 108 (90 BASE) MCG/ACT inhaler as needed.    . SUMAtriptan (IMITREX) 100 MG tablet Take 1 tablet (100 mg  total) by mouth every 2 (two) hours as needed for migraine. May repeat in 2 hours if headache persists or recurs. 10 tablet 3  . SYMBICORT 80-4.5 MCG/ACT inhaler Inhale 2 puffs into the lungs 2 (two) times daily.     No facility-administered medications prior to visit.      EXAM:  BP 122/84 (BP Location: Right Arm, Patient Position: Sitting, Cuff Size: Normal)   Pulse 93   Temp 97.7 F (36.5 C) (Oral)   Wt 130 lb 6.4 oz (59.1 kg)   BMI 23.85 kg/m   Body mass index is 23.85 kg/m.  GENERAL: vitals reviewed and listed above, alert, oriented, appears well hydrated and in no acute distress appears mildly ill nontoxic HEENT: atraumatic, conjunctiva  clear, no obvious abnormalities on inspection of external nose and ears TMs are clear OP : no lesion edema or exudate  NECK: no obvious masses on inspection palpation no adenopathy no meningismus. LUNGS: clear to auscultation bilaterally, no wheezes, rales or  rhonchi, good air movement CV: HRRR, no clubbing cyanosis or  peripheral edema nl cap refill  Abdomen bowel sounds are present no obvious hepatosplenomegaly there is localized right lower quadrant pain on minimal palpation without diffuse rebound.  Local guarding.  No mass-effect noted. MS: moves all extremities without noticeable focal  abnormality PSYCH: pleasant and cooperative,   Skin normal turgor and capillary refill no petechiae or unusual rashes.  ASSESSMENT AND PLAN:  Discussed the following assessment and plan:  Abdominal pain, RLQ (right lower quadrant) - Plan: Hepatic function panel, Basic metabolic panel, CBC with Differential/Platelet, POCT Urinalysis Dipstick (Automated), POCT urine pregnancy, CT Abdomen Pelvis W Contrast, C-reactive protein  Fever, unspecified fever cause - Plan: Hepatic function panel, Basic metabolic panel, CBC with Differential/Platelet, POCT Urinalysis Dipstick (Automated), POCT urine pregnancy, CT Abdomen Pelvis W Contrast, C-reactive protein  Generalized abdominal pain - Plan: CT Abdomen Pelvis W Contrast Presentation of fever for a week or more with now localized right lower quadrant abdominal pain.  Check blood work urinalysis pregnancy test abdominal CT abdomen pelvis consideration for appendicitis or other localized infection. Plan follow-up depending on results if getting worse seek care in the emergency department.  At this point she is well-hydrated and does not look toxic. -Patient advised to return or notify health care team  if symptoms worsen ,persist or new concerns arise.  Patient Instructions  Blood work urine today abdominal CT scan checking for appendicitis or other abdominal infectious process. We may end up referring you to the emergency room depending on results.  Contact us if things are getting worse through the day.  Stop the  Minocin for now.     Neta MendsWanda K. Paxten Appelt M.D.

## 2017-04-02 NOTE — Patient Instructions (Addendum)
Blood work urine today abdominal CT scan checking for appendicitis or other abdominal infectious process. We may end up referring you to the emergency room depending on results.  Contact us if things are getting worse through the day.  Stop the  Minocin for now.

## 2017-04-03 ENCOUNTER — Encounter: Payer: Self-pay | Admitting: Internal Medicine

## 2017-04-03 ENCOUNTER — Ambulatory Visit (INDEPENDENT_AMBULATORY_CARE_PROVIDER_SITE_OTHER): Payer: 59 | Admitting: Internal Medicine

## 2017-04-03 ENCOUNTER — Other Ambulatory Visit (INDEPENDENT_AMBULATORY_CARE_PROVIDER_SITE_OTHER): Payer: 59

## 2017-04-03 VITALS — BP 124/82 | HR 71 | Temp 98.6°F | Wt 129.2 lb

## 2017-04-03 DIAGNOSIS — R1031 Right lower quadrant pain: Secondary | ICD-10-CM | POA: Diagnosis not present

## 2017-04-03 DIAGNOSIS — R51 Headache: Secondary | ICD-10-CM | POA: Diagnosis not present

## 2017-04-03 DIAGNOSIS — R945 Abnormal results of liver function studies: Secondary | ICD-10-CM | POA: Diagnosis not present

## 2017-04-03 DIAGNOSIS — R509 Fever, unspecified: Secondary | ICD-10-CM | POA: Diagnosis not present

## 2017-04-03 DIAGNOSIS — E876 Hypokalemia: Secondary | ICD-10-CM

## 2017-04-03 DIAGNOSIS — R519 Headache, unspecified: Secondary | ICD-10-CM

## 2017-04-03 DIAGNOSIS — R7989 Other specified abnormal findings of blood chemistry: Secondary | ICD-10-CM

## 2017-04-03 LAB — HEPATIC FUNCTION PANEL
ALT: 82 U/L — ABNORMAL HIGH (ref 0–35)
AST: 70 U/L — AB (ref 0–37)
Albumin: 3.9 g/dL (ref 3.5–5.2)
Alkaline Phosphatase: 123 U/L — ABNORMAL HIGH (ref 39–117)
BILIRUBIN DIRECT: 0.1 mg/dL (ref 0.0–0.3)
BILIRUBIN TOTAL: 0.6 mg/dL (ref 0.2–1.2)
Total Protein: 6.3 g/dL (ref 6.0–8.3)

## 2017-04-03 LAB — MAGNESIUM: MAGNESIUM: 1.9 mg/dL (ref 1.5–2.5)

## 2017-04-03 LAB — POCT MONO (EPSTEIN BARR VIRUS): Mono, POC: NEGATIVE

## 2017-04-03 LAB — POTASSIUM: POTASSIUM: 3.4 meq/L — AB (ref 3.5–5.1)

## 2017-04-03 MED ORDER — RIZATRIPTAN BENZOATE 10 MG PO TBDP: 10.0000 mg | ORAL_TABLET | ORAL | 0 refills | Status: DC | PRN

## 2017-04-03 NOTE — Progress Notes (Signed)
Chief Complaint  Patient presents with  . Acute Visit    Pt still not feeling any better. Pt c/o neck pain and headache, pt states that she was having these symptoms before having the contrast for her scan 04/02/17. Pt states that she is having a shooting pain in left ear.     HPI: Jamie Cameron 51 y.o. comes in today for repeat follow-up blood work see previous notes has had prolonged febrile illness associated with abdominal pain trigger of her migraine headaches fatigue.  Her liver test showed some mild transaminase elevation. Asked to be seen today  Cause of headache   Last night   Was 98.8 and then clammy     And then ongoing headache.   Sumatriptan  And no tylenol and sharp apisn in ears .     Last night   No hihg fever.    abd pain about the same  Bloated and tender   Same as yesterday  .    Small bm.   Baseline headache throbbing in to neck and back .    And vomiting   About   4 x per month .    Stress  Odors  .   Are her triggers.    Not a different headache than usually.  However this headache is lasting longer.  She does have some shooting pain into her left ear.  No other new symptoms. ROS: See pertinent positives and negatives per HPI. Reports that her husband who is a pilot has been home for 4-5 weeks did develop a rash and a week ago may be a fever.  No other exposures. Past Medical History:  Diagnosis Date  . Allergy   . Asthma   . Endometriosis   . Hypokalemia   . Migraine     Family History  Problem Relation Age of Onset  . Hypertension Mother   . Diabetes Mother   . Heart attack Mother   . Hypertension Father   . Breast cancer Maternal Aunt        50's  . Breast cancer Maternal Aunt        60's  . Cancer Maternal Grandmother        cervical  . Breast cancer Maternal Grandmother        70's  . Diabetes Maternal Grandfather     Social History   Social History  . Marital status: Married    Spouse name: N/A  . Number of children: N/A  . Years of  education: N/A   Social History Main Topics  . Smoking status: Never Smoker  . Smokeless tobacco: Never Used  . Alcohol use No  . Drug use: No  . Sexual activity: Not Currently    Birth control/ protection: Other-see comments     Comment: vasectomy-1st intercourse 51 yo-Fewer than 5 partners   Other Topics Concern  . None   Social History Narrative  . None    Outpatient Medications Prior to Visit  Medication Sig Dispense Refill  . acetaminophen (TYLENOL) 500 MG tablet Take 500 mg by mouth every 6 (six) hours as needed.    . Calcium Carbonate-Vitamin D (CALCIUM + D PO) Take by mouth.      . chlorpheniramine-HYDROcodone (TUSSIONEX PENNKINETIC ER) 10-8 MG/5ML SUER Take 5 mLs by mouth at bedtime as needed for cough. 120 mL 0  . ibuprofen (ADVIL,MOTRIN) 800 MG tablet Take 1 tablet (800 mg total) by mouth every 8 (eight) hours as needed. 30  tablet 1  . minocycline (DYNACIN) 50 MG tablet Take 50 mg by mouth 2 (two) times daily.    Marland Kitchen NASONEX 50 MCG/ACT nasal spray as needed.    . NON FORMULARY TRILUMA.  1 application to face daily.    . ondansetron (ZOFRAN ODT) 8 MG disintegrating tablet Take 1 tablet (8 mg total) by mouth every 8 (eight) hours as needed for nausea or vomiting. 20 tablet 0  . PROAIR HFA 108 (90 BASE) MCG/ACT inhaler as needed.    . SUMAtriptan (IMITREX) 100 MG tablet Take 1 tablet (100 mg total) by mouth every 2 (two) hours as needed for migraine. May repeat in 2 hours if headache persists or recurs. 10 tablet 3  . SYMBICORT 80-4.5 MCG/ACT inhaler Inhale 2 puffs into the lungs 2 (two) times daily.     No facility-administered medications prior to visit.      EXAM:  BP 124/82 (BP Location: Left Arm, Patient Position: Sitting, Cuff Size: Normal)   Pulse 71   Temp 98.6 F (37 C) (Oral)   Wt 129 lb 3.2 oz (58.6 kg)   BMI 23.63 kg/m   Body mass index is 23.63 kg/m.  GENERAL: vitals reviewed and listed above, alert, oriented, appears well hydrated and in no acute  distress looks flushed and uncomfortable.  But nontoxic. HEENT: atraumatic, conjunctiva  clear, no obvious abnormalities on inspection of external nose and ears TMs are clear OP : no lesion edema or exudate  NECK: no obvious masses on inspection palpation mild tenderness left lateral neck shotty nodes no significant adenopathy or rash.  Supple LUNGS: clear to auscultation bilaterally, no wheezes, rales or rhonchi, good air movement CV: HRRR, no clubbing cyanosis or  peripheral edema nl cap refill  Abdomen soft without organomegaly she still has right lower quadrant tenderness with some localized rebound.  No masses are felt. Extremities show normal cap refill with no acute rashes or petechiae Neurologic appears to be nonfocal EOMs are normal. MS: moves all extremities without noticeable focal  abnormality PSYCH: pleasant and cooperative, nl mentation Wt Readings from Last 3 Encounters:  04/03/17 129 lb 3.2 oz (58.6 kg)  04/02/17 130 lb 6.4 oz (59.1 kg)  03/26/17 128 lb 14.4 oz (58.5 kg)    ASSESSMENT AND PLAN:  Discussed the following assessment and plan:  Acute nonintractable headache, unspecified headache type  Fever, unspecified fever cause - ony low grade last pm  but  had sweats  - Plan: POC Mono (Epstein Barr Virus)  Abdominal pain, RLQ (right lower quadrant)  Abnormal LFTs Discussed options for headache she states that her headaches are typical migraines but are becoming not responsive to the sumatriptan and lasting longer but does not want to take a lot of medicines that affect the liver.  Her exam is nonfocal can try different triptan we are avoiding narcotics although discussed tramadol could take 2 Aleve if needed but lots of fluids and hydration. If her headache is subsiding and her fever is indeed gone she can return to work on Friday 2 days from now if her hepatitis screen is negative.  As tolerated.  Monospot test today is negative -Patient advised to return or notify  health care team  if symptoms worsen ,persist or new concerns arise.  Patient Instructions  Ok to take 2 aleve  And try  maxalt  instead of  imitrex .  Contact  us  About   How you are doing.   Still relative rest and  Fluids   Potassium  In food  .           Neta MendsWanda K. Panosh M.D.

## 2017-04-03 NOTE — Patient Instructions (Addendum)
Ok to take 2 aleve  And try  maxalt  instead of  imitrex .  Contact  [place]  About   How you are doing.   Still relative rest and     Fluids   Potassium  In food  .

## 2017-04-06 LAB — EHRLICHIA ANTIBODY PANEL

## 2017-04-06 LAB — HEPATITIS PANEL, ACUTE
HEP A IGM: NONREACTIVE
HEP B S AG: NONREACTIVE
Hep B C IgM: NONREACTIVE
Hepatitis C Ab: NONREACTIVE
SIGNAL TO CUT-OFF: 0 (ref <1.00)

## 2017-04-06 LAB — CMV ABS, IGG+IGM (CYTOMEGALOVIRUS)
CMV IGM: 116 [AU]/ml — AB
CYTOMEGALOVIRUS AB-IGG: 0.91 U/mL — AB

## 2017-04-06 LAB — EXTRA LAV TOP TUBE

## 2017-04-06 LAB — HIV ANTIBODY (ROUTINE TESTING W REFLEX): HIV 1&2 Ab, 4th Generation: NONREACTIVE

## 2017-04-06 LAB — ROCKY MTN SPOTTED FVR ABS PNL(IGG+IGM)
RMSF IGG: NOT DETECTED
RMSF IgM: NOT DETECTED

## 2017-04-06 LAB — EPSTEIN-BARR VIRUS VCA ANTIBODY PANEL
EBV NA IGG: 81.4 U/mL — AB
EBV VCA IgG: 49 U/mL — ABNORMAL HIGH
EBV VCA IgM: <36 U/mL

## 2017-04-08 ENCOUNTER — Ambulatory Visit (INDEPENDENT_AMBULATORY_CARE_PROVIDER_SITE_OTHER): Payer: 59 | Admitting: Internal Medicine

## 2017-04-08 ENCOUNTER — Encounter: Payer: Self-pay | Admitting: Internal Medicine

## 2017-04-08 VITALS — BP 98/82 | HR 91 | Temp 98.0°F | Wt 127.2 lb

## 2017-04-08 DIAGNOSIS — R7989 Other specified abnormal findings of blood chemistry: Secondary | ICD-10-CM

## 2017-04-08 DIAGNOSIS — R945 Abnormal results of liver function studies: Secondary | ICD-10-CM | POA: Diagnosis not present

## 2017-04-08 DIAGNOSIS — B259 Cytomegaloviral disease, unspecified: Secondary | ICD-10-CM | POA: Diagnosis not present

## 2017-04-08 DIAGNOSIS — R509 Fever, unspecified: Secondary | ICD-10-CM

## 2017-04-08 NOTE — Patient Instructions (Addendum)
   You may have a virus caused cmv causing your symptoms  And this should go away with time .  But acts like mono .    willg et back with you about   Lab results .   and plan

## 2017-04-08 NOTE — Progress Notes (Signed)
Chief Complaint  Patient presents with  . Acute Visit    Pt still not feeling better. No fever for 2-3 days. Fatigued and headache still present. Discuss lab results.     HPI: Jamie Cameron [age] y.o.   Comes back in although feeling better has some  Difficult  news about  sp[place] with felt to be pneumonia possibly from HIV.  Last possible contact about 5 weeks ago. Her fevers are gone she has some fatigue but is a lot better the new medicine for her headaches is improving.  ROS: See pertinent positives and negatives per HPI. abd pain seems better  No ivdu   Risk  Contacts  Past Medical History:  Diagnosis Date  . Allergy   . Asthma   . Endometriosis   . Hypokalemia   . Migraine     Family History  Problem Relation Age of Onset  . Hypertension Mother   . Diabetes Mother   . Heart attack Mother   . Hypertension Father   . Breast cancer Maternal Aunt        50's  . Breast cancer Maternal Aunt        60's  . Cancer Maternal Grandmother        cervical  . Breast cancer Maternal Grandmother        70's  . Diabetes Maternal Grandfather     Social History   Socioeconomic History  . Marital status: Married    Spouse name: None  . Number of children: None  . Years of education: None  . Highest education level: None  Social Needs  . Financial resource strain: None  . Food insecurity - worry: None  . Food insecurity - inability: None  . Transportation needs - medical: None  . Transportation needs - non-medical: None  Occupational History  . None  Tobacco Use  . Smoking status: Never Smoker  . Smokeless tobacco: Never Used  Substance and Sexual Activity  . Alcohol use: No    Alcohol/week: 0.0 oz  . Drug use: No  . Sexual activity: Not Currently    Birth control/protection: Other-see comments    Comment: vasectomy-1st intercourse 51 yo-Fewer than 5 partners  Other Topics Concern  . None  Social History  Narrative  . None    Outpatient Medications Prior to Visit  Medication Sig Dispense Refill  . acetaminophen (TYLENOL) 500 MG tablet Take 500 mg by mouth every 6 (six) hours as needed.    . Calcium Carbonate-Vitamin D (CALCIUM + D PO) Take by mouth.      . chlorpheniramine-HYDROcodone (TUSSIONEX PENNKINETIC ER) 10-8 MG/5ML SUER Take 5 mLs by mouth at bedtime as needed for cough. 120 mL 0  . ibuprofen (ADVIL,MOTRIN) 800 MG tablet Take 1 tablet (800 mg total) by mouth every 8 (eight) hours as needed. 30 tablet 1  . minocycline (DYNACIN) 50 MG tablet Take 50 mg by mouth 2 (two) times daily.    Marland Kitchen. NASONEX 50 MCG/ACT nasal spray as needed.    . NON FORMULARY TRILUMA.  1 application to face daily.    . ondansetron (ZOFRAN ODT) 8 MG disintegrating tablet Take 1 tablet (8 mg total) by mouth every 8 (eight) hours as needed for nausea or vomiting. 20 tablet 0  . PROAIR HFA 108 (90 BASE) MCG/ACT inhaler as needed.    . rizatriptan (MAXALT-MLT) 10 MG disintegrating tablet Take 1 tablet (10 mg total) by mouth  as needed for migraine. May repeat in 2 hours if needed 10 tablet 0  . SYMBICORT 80-4.5 MCG/ACT inhaler Inhale 2 puffs into the lungs 2 (two) times daily.    . SUMAtriptan (IMITREX) 100 MG tablet Take 1 tablet (100 mg total) by mouth every 2 (two) hours as needed for migraine. May repeat in 2 hours if headache persists or recurs. (Patient not taking: Reported on 04/08/2017) 10 tablet 3   No facility-administered medications prior to visit.      EXAM:  BP 98/82 (BP Location: Left Arm, Patient Position: Sitting, Cuff Size: Normal)   Pulse 91   Temp 98 F (36.7 C) (Oral)   Wt 127 lb 3.2 oz (57.7 kg)   BMI 23.27 kg/m   Body mass index is 23.27 kg/m.  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  Expect lever panel to improve  With time  Total visit > 50% spent counseling and coordinating care as indicated in above note and in instructions to patient .  Will contact m her with results  And plan of action   Poss get ID involved .    -Patient advised to return or notify health care team  if symptoms worsen ,persist or new concerns arise.  Patient Instructions    You may have a virus caused cmv causing your symptoms  And this should go away with time .  But acts like mono .    willg et back with you about   Lab results .   and plan     Neta Mends. Panosh M.D.

## 2017-04-09 NOTE — Addendum Note (Signed)
Addended by: Maisie FusGREEN, Briana Farner M on: 04/09/2017 03:47 PM   Modules accepted: Orders

## 2017-04-10 ENCOUNTER — Telehealth: Payer: Self-pay | Admitting: Family Medicine

## 2017-04-10 DIAGNOSIS — R945 Abnormal results of liver function studies: Secondary | ICD-10-CM

## 2017-04-10 DIAGNOSIS — R7989 Other specified abnormal findings of blood chemistry: Secondary | ICD-10-CM

## 2017-04-10 LAB — HIV-1 RNA ULTRAQUANT REFLEX TO GENTYP+: HIV-1 RNA Quant, Log: <1.3 Log cps/mL

## 2017-04-10 LAB — RPR: RPR Ser Ql: NONREACTIVE

## 2017-04-10 NOTE — Telephone Encounter (Signed)
Pt would like Dr Fabian SharpPanosh to give her a call.  Pt declined to elaborate, stating Dr Fabian SharpPanosh was to be expecting her to call.   Pt saw Dr Glenna Durandpansoh 11/05

## 2017-04-11 NOTE — Telephone Encounter (Signed)
Notes recorded by Madelin HeadingsPanosh, Wanda K, MD on 04/11/2017 at 8:14 AM EST I have Already talked with patient this morning so dont have to call her but please put in future order for lfts Dx Abnormal lfts  Thanks ------ Notes recorded by Madelin HeadingsPanosh, Wanda K, MD on 04/11/2017 at 7:57 AM EST All tests negative  hiv not detected  Advise repeat lfts in  2 -3 weeks  To make sure  Going Back to normal  Dx abnormal lfts  Will let her know if other Actions required  Please call her on her cell phone

## 2017-04-11 NOTE — Telephone Encounter (Signed)
Order placed for future LFT labs. Nothing further needed.

## 2017-05-08 ENCOUNTER — Other Ambulatory Visit (INDEPENDENT_AMBULATORY_CARE_PROVIDER_SITE_OTHER): Payer: 59

## 2017-05-08 DIAGNOSIS — R945 Abnormal results of liver function studies: Secondary | ICD-10-CM

## 2017-05-08 DIAGNOSIS — R7989 Other specified abnormal findings of blood chemistry: Secondary | ICD-10-CM

## 2017-05-08 LAB — HEPATIC FUNCTION PANEL
ALBUMIN: 4.5 g/dL (ref 3.5–5.2)
ALK PHOS: 65 U/L (ref 39–117)
ALT: 16 U/L (ref 0–35)
AST: 19 U/L (ref 0–37)
BILIRUBIN DIRECT: 0.1 mg/dL (ref 0.0–0.3)
Total Bilirubin: 0.5 mg/dL (ref 0.2–1.2)
Total Protein: 6.7 g/dL (ref 6.0–8.3)

## 2017-05-16 ENCOUNTER — Other Ambulatory Visit: Payer: Self-pay | Admitting: Family Medicine

## 2017-05-16 DIAGNOSIS — R945 Abnormal results of liver function studies: Secondary | ICD-10-CM

## 2017-05-16 DIAGNOSIS — R7989 Other specified abnormal findings of blood chemistry: Secondary | ICD-10-CM

## 2017-07-03 ENCOUNTER — Other Ambulatory Visit (INDEPENDENT_AMBULATORY_CARE_PROVIDER_SITE_OTHER): Payer: 59

## 2017-07-03 DIAGNOSIS — R945 Abnormal results of liver function studies: Secondary | ICD-10-CM

## 2017-07-03 DIAGNOSIS — R7989 Other specified abnormal findings of blood chemistry: Secondary | ICD-10-CM

## 2017-07-03 LAB — HEPATIC FUNCTION PANEL
ALK PHOS: 59 U/L (ref 39–117)
ALT: 15 U/L (ref 0–35)
AST: 15 U/L (ref 0–37)
Albumin: 4.3 g/dL (ref 3.5–5.2)
BILIRUBIN TOTAL: 0.4 mg/dL (ref 0.2–1.2)
Bilirubin, Direct: 0.1 mg/dL (ref 0.0–0.3)
Total Protein: 6.5 g/dL (ref 6.0–8.3)

## 2017-07-04 LAB — HIV ANTIBODY (ROUTINE TESTING W REFLEX): HIV 1&2 Ab, 4th Generation: NONREACTIVE

## 2017-07-22 ENCOUNTER — Telehealth: Payer: Self-pay | Admitting: Internal Medicine

## 2017-07-23 NOTE — Telephone Encounter (Signed)
Pt following up on this med request.  Pt has had migraine 2 days and this is only thing that helps  rizatriptan (MAXALT-MLT) 10 MG disintegrating tablet  [facility] [id number] - [place] - [address] [facility] [address] [facility] [address] [phone] (Phone) [phone] (Fax)

## 2017-07-24 MED ORDER — RIZATRIPTAN BENZOATE 10 MG PO TBDP: ORAL_TABLET | ORAL | 1 refills | Status: DC

## 2017-07-24 NOTE — Telephone Encounter (Signed)
I sent   In refill (  I never got the original request. )

## 2017-07-24 NOTE — Addendum Note (Signed)
Addended byMadelin Headings: PANOSH, WANDA K on: 07/24/2017 09:37 AM   Modules accepted: Orders

## 2017-07-24 NOTE — Telephone Encounter (Signed)
Pt aware via detailed voicemail that Rx has been sent to pharmacy. Nothing further needed.

## 2017-12-02 ENCOUNTER — Telehealth: Payer: Self-pay | Admitting: *Deleted

## 2017-12-02 DIAGNOSIS — Z113 Encounter for screening for infections with a predominantly sexual mode of transmission: Secondary | ICD-10-CM

## 2017-12-02 DIAGNOSIS — Z708 Other sex counseling: Secondary | ICD-10-CM

## 2017-12-02 NOTE — Telephone Encounter (Signed)
Copied from CRM [phone]. Topic: Referral - Question >> Dec 02, 2017  2:40 PM Floria RavelingStovall, Shana A wrote: Reason for CRM:  pt called in and stated that she needed some referral to a couple of drs and needs certain things sent to those drs.  She would not give me any other info on the dr or referrals.  She just wanted to speak with DR burchette CMA   Best number  [phone]   Left message on machine returning patient's call.  CRM created

## 2017-12-03 NOTE — Telephone Encounter (Signed)
Pt declined to give any other information about the referral and what it is for to me or anyone else here. Pt states she prefers to speak with Dr Caryl NeverBurchette personally. Pt states he is familiar with what she needs. [phone]

## 2017-12-04 NOTE — Telephone Encounter (Signed)
Patient called back and said she would prefer a return call during morning hours

## 2017-12-04 NOTE — Telephone Encounter (Signed)
Left message on machine for patient returning her call 

## 2017-12-09 NOTE — Telephone Encounter (Signed)
Ok to refer.

## 2017-12-09 NOTE — Telephone Encounter (Signed)
Copied from CRM #[id number]. Topic: Referral - Request >> Dec 09, 2017 10:19 AM Marylen [id number], Ja-Kwan wrote: Reason for CRM: Pt states she needs a referral to [facility] to Dr. Gay FillerMarc Johnson located at [address] [place], [place] [address] Phone# [phone]  Attention: Raquel

## 2017-12-09 NOTE — Telephone Encounter (Signed)
Okay to refer? 

## 2017-12-09 NOTE — Telephone Encounter (Signed)
Left message on machine for patient to return our call.  CRM created 

## 2017-12-25 ENCOUNTER — Encounter: Payer: BLUE CROSS/BLUE SHIELD | Admitting: Gynecology

## 2017-12-25 DIAGNOSIS — Z1231 Encounter for screening mammogram for malignant neoplasm of breast: Secondary | ICD-10-CM | POA: Diagnosis not present

## 2018-01-02 DIAGNOSIS — Z7251 High risk heterosexual behavior: Secondary | ICD-10-CM | POA: Diagnosis not present

## 2018-01-20 ENCOUNTER — Encounter: Payer: Self-pay | Admitting: Gynecology

## 2018-01-20 ENCOUNTER — Ambulatory Visit (INDEPENDENT_AMBULATORY_CARE_PROVIDER_SITE_OTHER): Payer: BLUE CROSS/BLUE SHIELD | Admitting: Gynecology

## 2018-01-20 VITALS — BP 116/66 | Ht 61.0 in | Wt 129.0 lb

## 2018-01-20 DIAGNOSIS — Z01419 Encounter for gynecological examination (general) (routine) without abnormal findings: Secondary | ICD-10-CM

## 2018-01-20 DIAGNOSIS — Z1151 Encounter for screening for human papillomavirus (HPV): Secondary | ICD-10-CM

## 2018-01-20 DIAGNOSIS — R42 Dizziness and giddiness: Secondary | ICD-10-CM | POA: Diagnosis not present

## 2018-01-20 DIAGNOSIS — R102 Pelvic and perineal pain: Secondary | ICD-10-CM | POA: Diagnosis not present

## 2018-01-20 NOTE — Progress Notes (Signed)
    Jamie EllisCindy L Cameron 1965-08-26 [id number]        52 y.o.  G1P1 for annual gynecologic exam.  Has been having some issues with pelvic pain on and off over the past several years.  Question as to whether she has had endometriosis as the source.  Last ultrasound 2017 showed an area left ovary 16 x 14 x 12 mm with low-level echoes questionable small endometrioma.  Most recently had a CT scan 04/02/2017 which showed a 3.7 cm left ovarian cyst with some free fluid in the cul-de-sac/right adnexal area questionable ruptured right ovarian cyst.  Right ovary though was normal.  Continues with light monthly menses.  Past medical history,surgical history, problem list, medications, allergies, family history and social history were all reviewed and documented as reviewed in the EPIC chart.  ROS:  Performed with pertinent positives and negatives included in the history, assessment and plan.   Additional significant findings : None   Exam: Kennon PortelaKim Gardner assistant Vitals:   01/20/18 1532  BP: 116/66  Weight: 129 lb (58.5 kg)  Height: 5\' 1"  (1.549 m)   Body mass index is 24.37 kg/m.  General appearance:  Normal affect, orientation and appearance. Skin: Grossly normal HEENT: Without gross lesions.  No cervical or supraclavicular adenopathy. Thyroid normal.  Lungs:  Clear without wheezing, rales or rhonchi Cardiac: RR, without RMG Abdominal:  Soft, nontender, without masses, guarding, rebound, organomegaly or hernia Breasts:  Examined lying and sitting without masses, retractions, discharge or axillary adenopathy. Pelvic:  Ext, BUS, Vagina: Normal  Cervix: Normal.  Pap smear/HPV  Uterus: Anteverted, normal size, shape and contour, midline and mobile nontender   Adnexa: Without masses or tenderness    Anus and perineum: Normal   Rectovaginal: Normal sphincter tone without palpated masses or tenderness.    Assessment/Plan:  52 y.o. G1P1 female for annual gynecologic exam with regular light menses.   Vasectomy birth control.   1. On and off pelvic pain for years.  Questionable endometriosis.  Recent CT scan showed 3.7 cm left ovarian cyst.  Will start with ultrasound now.  Various scenarios reviewed to include laparoscopy.  We also discussed that she is 52 in the perimenopausal time.  Although she is not having overt menopausal symptoms.  We will also check a baseline FSH. 2. Some dizziness on and off.  History of low potassium in the past.  Will check CBC, CMP, TSH and FSH.  She will follow-up with primary physician if continues to be an issue. 3. Colonoscopy due now and patient is going to schedule over the next year or so. 4. Mammography 12/2017.  Continue with annual mammography next year.  Breast exam normal today. 5. Pap smear 2016.  Pap smear/HPV today.  No history of abnormal Pap smears previously. 6. Health maintenance.  CBC, CMP, TSH and FSH drawn.  Lipid profile last year good not repeated.  Follow-up for ultrasound and lab results.  Follow-up in 1 year for annual exam.   Dara Lordsimothy P Montario Zilka MD, 4:39 PM 01/20/2018

## 2018-01-20 NOTE — Patient Instructions (Signed)
Follow up for ultrasound as scheduled 

## 2018-01-21 LAB — CBC WITH DIFFERENTIAL/PLATELET
BASOS ABS: 60 {cells}/uL (ref 0–200)
Basophils Relative: 0.9 %
EOS PCT: 1.3 %
Eosinophils Absolute: 87 cells/uL (ref 15–500)
HCT: 38.7 % (ref 35.0–45.0)
Hemoglobin: 13.2 g/dL (ref 11.7–15.5)
Lymphs Abs: 2305 cells/uL (ref 850–3900)
MCH: 32 pg (ref 27.0–33.0)
MCHC: 34.1 g/dL (ref 32.0–36.0)
MCV: 93.9 fL (ref 80.0–100.0)
MONOS PCT: 7.4 %
MPV: 11.9 fL (ref 7.5–12.5)
NEUTROS ABS: 3752 {cells}/uL (ref 1500–7800)
NEUTROS PCT: 56 %
Platelets: 323 10*3/uL (ref 140–400)
RBC: 4.12 10*6/uL (ref 3.80–5.10)
RDW: 12.2 % (ref 11.0–15.0)
Total Lymphocyte: 34.4 %
WBC mixed population: 496 cells/uL (ref 200–950)
WBC: 6.7 10*3/uL (ref 3.8–10.8)

## 2018-01-21 LAB — PAP IG AND HPV HIGH-RISK: HPV DNA High Risk: NOT DETECTED

## 2018-01-21 LAB — COMPREHENSIVE METABOLIC PANEL
AG Ratio: 2.4 (calc) (ref 1.0–2.5)
ALBUMIN MSPROF: 4.5 g/dL (ref 3.6–5.1)
ALT: 15 U/L (ref 6–29)
AST: 18 U/L (ref 10–35)
Alkaline phosphatase (APISO): 54 U/L (ref 33–130)
BUN: 13 mg/dL (ref 7–25)
CALCIUM: 9.3 mg/dL (ref 8.6–10.4)
CO2: 29 mmol/L (ref 20–32)
CREATININE: 0.91 mg/dL (ref 0.50–1.05)
Chloride: 102 mmol/L (ref 98–110)
GLUCOSE: 85 mg/dL (ref 65–99)
Globulin: 1.9 g/dL (calc) (ref 1.9–3.7)
Potassium: 3.6 mmol/L (ref 3.5–5.3)
SODIUM: 139 mmol/L (ref 135–146)
Total Bilirubin: 0.4 mg/dL (ref 0.2–1.2)
Total Protein: 6.4 g/dL (ref 6.1–8.1)

## 2018-01-21 LAB — FOLLICLE STIMULATING HORMONE: FSH: 15.4 m[IU]/mL

## 2018-01-21 LAB — TSH: TSH: 1.91 mIU/L

## 2018-01-23 ENCOUNTER — Telehealth: Payer: Self-pay | Admitting: *Deleted

## 2018-01-23 MED ORDER — SUMATRIPTAN SUCCINATE 100 MG PO TABS: 100.0000 mg | ORAL_TABLET | ORAL | 3 refills | Status: DC | PRN

## 2018-01-23 NOTE — Telephone Encounter (Signed)
Okay to refill x3

## 2018-01-23 NOTE — Telephone Encounter (Signed)
Rx sent 

## 2018-01-23 NOTE — Telephone Encounter (Signed)
Patient called requesting a refill on sumatriptan 100 mg tablet,states you have done this in past. Was her for annual exam on 01/20/18 and forgot to ask. Please advise

## 2018-02-12 ENCOUNTER — Ambulatory Visit: Payer: BLUE CROSS/BLUE SHIELD | Admitting: Gynecology

## 2018-02-12 ENCOUNTER — Other Ambulatory Visit: Payer: BLUE CROSS/BLUE SHIELD

## 2018-03-07 ENCOUNTER — Ambulatory Visit: Payer: BLUE CROSS/BLUE SHIELD | Admitting: Podiatry

## 2018-03-27 ENCOUNTER — Other Ambulatory Visit: Payer: Self-pay | Admitting: Podiatry

## 2018-03-27 ENCOUNTER — Ambulatory Visit (INDEPENDENT_AMBULATORY_CARE_PROVIDER_SITE_OTHER): Payer: BLUE CROSS/BLUE SHIELD

## 2018-03-27 ENCOUNTER — Encounter: Payer: Self-pay | Admitting: Gynecology

## 2018-03-27 ENCOUNTER — Other Ambulatory Visit: Payer: Self-pay | Admitting: Gynecology

## 2018-03-27 ENCOUNTER — Ambulatory Visit (INDEPENDENT_AMBULATORY_CARE_PROVIDER_SITE_OTHER): Payer: BLUE CROSS/BLUE SHIELD | Admitting: Gynecology

## 2018-03-27 ENCOUNTER — Ambulatory Visit (INDEPENDENT_AMBULATORY_CARE_PROVIDER_SITE_OTHER): Payer: BLUE CROSS/BLUE SHIELD | Admitting: Podiatry

## 2018-03-27 VITALS — BP 118/76

## 2018-03-27 DIAGNOSIS — R102 Pelvic and perineal pain: Secondary | ICD-10-CM | POA: Diagnosis not present

## 2018-03-27 DIAGNOSIS — M722 Plantar fascial fibromatosis: Secondary | ICD-10-CM

## 2018-03-27 DIAGNOSIS — N83201 Unspecified ovarian cyst, right side: Secondary | ICD-10-CM | POA: Diagnosis not present

## 2018-03-27 DIAGNOSIS — N83202 Unspecified ovarian cyst, left side: Secondary | ICD-10-CM | POA: Diagnosis not present

## 2018-03-27 DIAGNOSIS — Z8669 Personal history of other diseases of the nervous system and sense organs: Secondary | ICD-10-CM

## 2018-03-27 DIAGNOSIS — M216X9 Other acquired deformities of unspecified foot: Secondary | ICD-10-CM

## 2018-03-27 DIAGNOSIS — M778 Other enthesopathies, not elsewhere classified: Secondary | ICD-10-CM

## 2018-03-27 DIAGNOSIS — M779 Enthesopathy, unspecified: Principal | ICD-10-CM

## 2018-03-27 MED ORDER — RIZATRIPTAN BENZOATE 10 MG PO TBDP: ORAL_TABLET | ORAL | 1 refills | Status: DC

## 2018-03-27 MED ORDER — DICLOFENAC POTASSIUM(MIGRAINE) 50 MG PO PACK: 1.0000 | PACK | Freq: Once | ORAL | 4 refills | Status: AC

## 2018-03-27 NOTE — Patient Instructions (Signed)

## 2018-03-27 NOTE — Patient Instructions (Signed)
Follow up for ultrasound as scheduled 

## 2018-03-27 NOTE — Progress Notes (Addendum)
    Jamie Cameron 02-Jan-1966 [id number]        52 y.o.  G1P1 presents for ultrasound.  History of pelvic pain on and off over the past 4 years.  Had CT scan 03/2017 which showed a 3.7 cm left ovarian cyst with some free fluid in the right adnexa questionable ruptured right ovarian cyst.  Right ovary was normal at that time.  Past medical history,surgical history, problem list, medications, allergies, family history and social history were all reviewed and documented in the EPIC chart.  Directed ROS with pertinent positives and negatives documented in the history of present illness/assessment and plan.  Exam: Vitals:   03/27/18 1052  BP: 118/76   General appearance:  Normal  Ultrasound transvaginal shows uterus normal size and echotexture.  Endometrial echo 2.8 mm.  Right ovary with thin-walled echo-free avascular cyst 40 x 38 x 29 mm.  Negative color flow Doppler.  Left ovary with thin-walled echo-free avascular cyst 33 x 39 x 34 mm with negative color flow Doppler.  Cul-de-sac negative  Assessment/Plan:  52 y.o. G1P1 with small simple avascular bilateral ovarian cysts.  Differential was reviewed with the patient to include functional versus pathologic such as endometriomas/dermoid/other benign tumors up to and including ovarian cancer.  Given the small size, simple nature and lack of blood flow suspect benign etiology.  We discussed options to include expectant management with follow-up ultrasound in several months just to assure stability/resolution versus possibility for laparoscopy with removal.  At this point the patient is comfortable with observation will follow-up for ultrasound in 3 months.  I refilled her Maxalt for her migraines.  I also discussed alternative treatments and will give a trial of Cambia 50 mg packet #8 with several refills.  To take at the very earliest onset of headache x1.    [name] MD, 11:39 AM 03/27/2018

## 2018-03-28 ENCOUNTER — Telehealth: Payer: Self-pay | Admitting: *Deleted

## 2018-03-28 MED ORDER — DICLOFENAC SODIUM 50 MG PO TBEC: DELAYED_RELEASE_TABLET | ORAL | 4 refills | Status: DC

## 2018-03-28 NOTE — Telephone Encounter (Signed)
Rx sent 

## 2018-03-28 NOTE — Telephone Encounter (Signed)
Okay to substitute

## 2018-03-28 NOTE — Telephone Encounter (Signed)
[facility] sent fax Diclofenac potassium 50 mg tablet on backorder, asked if prescribe diclofenac sodium 50 mg? Please advise

## 2018-03-31 DIAGNOSIS — Z7251 High risk heterosexual behavior: Secondary | ICD-10-CM | POA: Diagnosis not present

## 2018-04-17 ENCOUNTER — Encounter: Payer: Self-pay | Admitting: Podiatry

## 2018-04-17 ENCOUNTER — Ambulatory Visit (INDEPENDENT_AMBULATORY_CARE_PROVIDER_SITE_OTHER): Payer: BLUE CROSS/BLUE SHIELD | Admitting: Podiatry

## 2018-04-17 DIAGNOSIS — M722 Plantar fascial fibromatosis: Secondary | ICD-10-CM | POA: Diagnosis not present

## 2018-04-17 DIAGNOSIS — M2142 Flat foot [pes planus] (acquired), left foot: Secondary | ICD-10-CM | POA: Diagnosis not present

## 2018-04-17 DIAGNOSIS — M2141 Flat foot [pes planus] (acquired), right foot: Secondary | ICD-10-CM | POA: Diagnosis not present

## 2018-04-17 NOTE — Progress Notes (Signed)
Subjective:  Patient ID: Jamie Cameron, female    DOB: 1965-06-17,  MRN: [id number]  Chief Complaint  Patient presents with  . Plantar Fasciitis    B/L feet are better but still a little sore esp. at night    52 y.o. female presents with the above complaint. Feet are doing better state the injection helped.  Review of Systems: Negative except as noted in the HPI. Denies N/V/F/Ch.  Past Medical History:  Diagnosis Date  . Allergy   . Asthma   . Endometriosis   . Hypokalemia   . Migraine     Current Outpatient Medications:  .  acetaminophen (TYLENOL) 500 MG tablet, Take 500 mg by mouth every 6 (six) hours as needed., Disp: , Rfl:  .  Calcium Carbonate-Vitamin D (CALCIUM + D PO), Take by mouth.  , Disp: , Rfl:  .  DESCOVY 200-25 MG tablet, , Disp: , Rfl: 3 .  diclofenac (VOLTAREN) 50 MG EC tablet, Take 1 tablet by mouth once at onset headache, Disp: 8 tablet, Rfl: 4 .  ibuprofen (ADVIL,MOTRIN) 800 MG tablet, Take 1 tablet (800 mg total) by mouth every 8 (eight) hours as needed., Disp: 30 tablet, Rfl: 1 .  minocycline (DYNACIN) 50 MG tablet, Take 50 mg by mouth 2 (two) times daily., Disp: , Rfl:  .  NASONEX 50 MCG/ACT nasal spray, as needed., Disp: , Rfl:  .  ondansetron (ZOFRAN ODT) 8 MG disintegrating tablet, Take 1 tablet (8 mg total) by mouth every 8 (eight) hours as needed for nausea or vomiting., Disp: 20 tablet, Rfl: 0 .  PROAIR HFA 108 (90 BASE) MCG/ACT inhaler, as needed., Disp: , Rfl:  .  rizatriptan (MAXALT-MLT) 10 MG disintegrating tablet, DISSOLVE 1 TABLET BY MOUTH AS NEEDED FOR MIGRAINE, MAY REPEAT IN 2 HOURS IF NEEDED, Disp: 10 tablet, Rfl: 1 .  SUMAtriptan (IMITREX) 100 MG tablet, Take 1 tablet (100 mg total) by mouth every 2 (two) hours as needed for migraine. May repeat in 2 hours if headache persists or recurs., Disp: 10 tablet, Rfl: 3 .  SYMBICORT 80-4.5 MCG/ACT inhaler, Inhale 2 puffs into the lungs 2 (two) times daily., Disp: , Rfl:  .  TRUVADA 200-300 MG  tablet, , Disp: , Rfl: 0  Social History   Tobacco Use  Smoking Status Never Smoker  Smokeless Tobacco Never Used    Allergies  Allergen Reactions  . Latex Hives, Shortness Of Breath, Itching, Swelling and Rash  . Sulfacetamide Sodium Nausea And Vomiting  . Prednisone Nausea And Vomiting   Objective:  There were no vitals filed for this visit. There is no height or weight on file to calculate BMI. Constitutional Well developed. Well nourished.  Vascular Dorsalis pedis pulses palpable bilaterally. Posterior tibial pulses palpable bilaterally. Capillary refill normal to all digits.  No cyanosis or clubbing noted. Pedal hair growth normal.  Neurologic Normal speech. Oriented to person, place, and time. Epicritic sensation to light touch grossly present bilaterally.  Dermatologic Nails well groomed and normal in appearance. No open wounds. No skin lesions.  Orthopedic: Normal joint ROM without pain or crepitus bilaterally. No visible deformities. Tender to palpation at the calcaneal tuber bilaterally. No pain with calcaneal squeeze bilaterally. Ankle ROM diminished range of motion bilaterally. Silfverskiold Test: positive bilaterally.   Radiographs: None today Assessment:   1. Plantar fasciitis    Plan:  Patient was evaluated and treated and all questions answered.  Plantar Fasciitis, bilaterally - Continue stretching and icing, no injection today. - DME:  Plantar fascial braces dispensed x2. Casted for [employer].  Return in about 6 weeks (around 05/29/2018).

## 2018-04-23 NOTE — Progress Notes (Signed)
Subjective:  Patient ID: Jamie Cameron, female    DOB: 05/19/1966,  MRN: [id number]  Chief Complaint  Patient presents with  . Foot Pain    Bilateral heel pain duration 3 weeks  . Nail Problem    Right 1st toenail has white growth duration 1 month, left 1st toenail was injured in pedicure and fell off duration 1 month.    52 y.o. female presents with the above complaint.  History as above.  Review of Systems: Negative except as noted in the HPI. Denies N/V/F/Ch.  Past Medical History:  Diagnosis Date  . Allergy   . Asthma   . Endometriosis   . Hypokalemia   . Migraine     Current Outpatient Medications:  .  acetaminophen (TYLENOL) 500 MG tablet, Take 500 mg by mouth every 6 (six) hours as needed., Disp: , Rfl:  .  Calcium Carbonate-Vitamin D (CALCIUM + D PO), Take by mouth.  , Disp: , Rfl:  .  DESCOVY 200-25 MG tablet, , Disp: , Rfl: 3 .  diclofenac (VOLTAREN) 50 MG EC tablet, Take 1 tablet by mouth once at onset headache, Disp: 8 tablet, Rfl: 4 .  ibuprofen (ADVIL,MOTRIN) 800 MG tablet, Take 1 tablet (800 mg total) by mouth every 8 (eight) hours as needed., Disp: 30 tablet, Rfl: 1 .  minocycline (DYNACIN) 50 MG tablet, Take 50 mg by mouth 2 (two) times daily., Disp: , Rfl:  .  NASONEX 50 MCG/ACT nasal spray, as needed., Disp: , Rfl:  .  ondansetron (ZOFRAN ODT) 8 MG disintegrating tablet, Take 1 tablet (8 mg total) by mouth every 8 (eight) hours as needed for nausea or vomiting., Disp: 20 tablet, Rfl: 0 .  PROAIR HFA 108 (90 BASE) MCG/ACT inhaler, as needed., Disp: , Rfl:  .  rizatriptan (MAXALT-MLT) 10 MG disintegrating tablet, DISSOLVE 1 TABLET BY MOUTH AS NEEDED FOR MIGRAINE, MAY REPEAT IN 2 HOURS IF NEEDED, Disp: 10 tablet, Rfl: 1 .  SUMAtriptan (IMITREX) 100 MG tablet, Take 1 tablet (100 mg total) by mouth every 2 (two) hours as needed for migraine. May repeat in 2 hours if headache persists or recurs., Disp: 10 tablet, Rfl: 3 .  SYMBICORT 80-4.5 MCG/ACT inhaler, Inhale 2  puffs into the lungs 2 (two) times daily., Disp: , Rfl:  .  TRUVADA 200-300 MG tablet, , Disp: , Rfl: 0  Social History   Tobacco Use  Smoking Status Never Smoker  Smokeless Tobacco Never Used    Allergies  Allergen Reactions  . Latex Hives, Shortness Of Breath, Itching, Swelling and Rash  . Sulfacetamide Sodium Nausea And Vomiting  . Prednisone Nausea And Vomiting   Objective:  There were no vitals filed for this visit. There is no height or weight on file to calculate BMI. Constitutional Well developed. Well nourished.  Vascular Dorsalis pedis pulses palpable bilaterally. Posterior tibial pulses palpable bilaterally. Capillary refill normal to all digits.  No cyanosis or clubbing noted. Pedal hair growth normal.  Neurologic Normal speech. Oriented to person, place, and time. Epicritic sensation to light touch grossly present bilaterally.  Dermatologic Nails well groomed and normal in appearance. No open wounds. No skin lesions.  Orthopedic: Normal joint ROM without pain or crepitus bilaterally. No visible deformities. Tender to palpation at the calcaneal tuber bilaterally. No pain with calcaneal squeeze bilaterally. Ankle ROM diminished range of motion bilaterally. Silfverskiold Test: positive bilaterally.   Radiographs: Taken and reviewed. No acute fractures or dislocations. No evidence of stress fracture.  Plantar heel spur  absent. Posterior heel spur present.  Assessment:   1. Plantar fasciitis, left   2. Plantar fasciitis, right   3. Equinus deformity of foot    Plan:  Patient was evaluated and treated and all questions answered.  Plantar Fasciitis, bilaterally - XR reviewed as above.  - Educated on icing and stretching. Instructions given.  - Injection delivered to the plantar fascia as below. - DME: Night splint dispensed. - Pharmacologic management: OTC NSAIDs  Procedure: Injection Tendon/Ligament Location: Bilateral plantar fascia at the glabrous  junction; medial approach. Skin Prep: alcohol Injectate: 1 cc 0.5% marcaine plain, 1 cc dexamethasone phosphate, 0.5 cc kenalog 10. Disposition: Patient tolerated procedure well. Injection site dressed with a band-aid.  Return in about 3 weeks (around 04/17/2018) for Plantar fasciitis, Bilateral.

## 2018-05-07 ENCOUNTER — Encounter: Payer: Self-pay | Admitting: Podiatry

## 2018-05-12 ENCOUNTER — Encounter: Payer: Self-pay | Admitting: Family Medicine

## 2018-05-12 ENCOUNTER — Ambulatory Visit (INDEPENDENT_AMBULATORY_CARE_PROVIDER_SITE_OTHER): Payer: BLUE CROSS/BLUE SHIELD | Admitting: Family Medicine

## 2018-05-12 ENCOUNTER — Other Ambulatory Visit: Payer: Self-pay

## 2018-05-12 VITALS — BP 120/80 | HR 83 | Temp 98.1°F | Ht 61.0 in | Wt 128.8 lb

## 2018-05-12 DIAGNOSIS — J209 Acute bronchitis, unspecified: Secondary | ICD-10-CM

## 2018-05-12 MED ORDER — LEVOFLOXACIN 500 MG PO TABS: 500.0000 mg | ORAL_TABLET | Freq: Every day | ORAL | 0 refills | Status: DC

## 2018-05-12 MED ORDER — HYDROCOD POLST-CPM POLST ER 10-8 MG/5ML PO SUER: 5.0000 mL | Freq: Two times a day (BID) | ORAL | 0 refills | Status: DC | PRN

## 2018-05-12 NOTE — Progress Notes (Signed)
  Subjective:     Patient ID: Jamie Cameron, female   DOB: Oct 09, 1965, 52 y.o.   MRN: [id number]  HPI Patient is seen with acute bronchial illness.  She is non-smoker.  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  She is had over a week of some cough productive of green sputum.  She is had some nasal congestion.  Low-grade fever starting yesterday.  No dyspnea.  Increased fatigue.  No nausea or vomiting.  She states she has been prone to pneumonia several times in the past.  No obvious wheezing.  Cough severe at night.  Not responding to Mucinex.  She has used Tussionex in the past which does help.  Requesting refill of Tussionex to use just at night for severe cough  She states she has not done well with doxycycline or Zithromax in the past.  Past Medical History:  Diagnosis Date  . Allergy   . Asthma   . Endometriosis   . Hypokalemia   . Migraine    Past Surgical History:  Procedure Laterality Date  . BREAST SURGERY  1999   Left benign lymph node  . CHOLECYSTECTOMY    . FOOT SURGERY    . PELVIC LAPAROSCOPY  1996   endometriosis    reports that she has never smoked. She has never used smokeless tobacco. She reports that she does not drink alcohol or use drugs. family history includes Breast cancer in her maternal aunt, maternal aunt, and maternal grandmother; Cancer in her maternal grandmother; Diabetes in her maternal grandfather and mother; Heart attack in her mother; Hypertension in her father and mother. Allergies  Allergen Reactions  . Latex Hives, Shortness Of Breath, Itching, Swelling and Rash  . Sulfacetamide Sodium Nausea And Vomiting  . Prednisone Nausea And Vomiting     Review of Systems  Constitutional: Positive for fatigue.  HENT: Positive for congestion.   Respiratory: Positive for cough. Negative for shortness of breath and wheezing.   Cardiovascular: Negative for chest pain.       Objective:   Physical Exam  Constitutional: She appears well-developed and  well-nourished.  HENT:  Right Ear: External ear normal.  Left Ear: External ear normal.  Mouth/Throat: Oropharynx is clear and moist.  Neck: Neck supple.  Cardiovascular: Normal rate and regular rhythm.  Pulmonary/Chest: Effort normal and breath sounds normal. She has no wheezes. She has no rales.  Lymphadenopathy:    She has no cervical adenopathy.       Assessment:     Cough.  Suspect acute bronchitis.  She does describe low-grade fever starting several days into illness yesterday but has nonfocal exam today    Plan:     -Limited Tussionex 1 teaspoon nightly for severe cough 120 mL's with no refill -Levaquin 500 mg once daily.  Follow-up immediately for any increased fever, dyspnea, or other concerns  Kristian CoveyBruce W Mariesa Grieder MD [facility]

## 2018-05-12 NOTE — Patient Instructions (Signed)

## 2018-05-26 ENCOUNTER — Telehealth: Payer: Self-pay | Admitting: Family Medicine

## 2018-05-26 NOTE — Telephone Encounter (Signed)
Copied from CRM [phone]. Topic: Quick Communication - See Telephone Encounter >> May 26, 2018 11:19 AM Lorrine KinMcGee, Saffron Busey B, NT wrote: CRM for notification. See Telephone encounter for: 05/26/18. Patient calling and states that she is still feeling a little down. Does not have her voice back completely and is still coughing a lot, but does not have a fever. Would like to know what Dr Caryl NeverBurchette thinks of putting her on another round of levoquin to try to get the crud gone for good?  CB#:[phone] [id number] - [place], [place] - [address] AT [facility] [address] [facility] [address]

## 2018-05-26 NOTE — Telephone Encounter (Signed)
Called patient and gave her message from Dr. Caryl NeverBurchette. Patient verbalized an understanding and stated she will call back if she is not feeling better by Thursday as she works Friday.

## 2018-05-26 NOTE — Telephone Encounter (Signed)
If no fever, I would favor giving this more time.  My concern for prolonged broad spectrum antibiotic use is increased risk of C dif.

## 2018-05-30 ENCOUNTER — Ambulatory Visit: Payer: BLUE CROSS/BLUE SHIELD | Admitting: Podiatry

## 2018-06-03 ENCOUNTER — Other Ambulatory Visit: Payer: BLUE CROSS/BLUE SHIELD | Admitting: Orthotics

## 2018-06-05 ENCOUNTER — Ambulatory Visit (INDEPENDENT_AMBULATORY_CARE_PROVIDER_SITE_OTHER): Payer: BLUE CROSS/BLUE SHIELD | Admitting: Podiatry

## 2018-06-05 ENCOUNTER — Ambulatory Visit: Payer: BLUE CROSS/BLUE SHIELD | Admitting: Orthotics

## 2018-06-05 DIAGNOSIS — M2141 Flat foot [pes planus] (acquired), right foot: Secondary | ICD-10-CM

## 2018-06-05 DIAGNOSIS — M2142 Flat foot [pes planus] (acquired), left foot: Secondary | ICD-10-CM

## 2018-06-05 DIAGNOSIS — M722 Plantar fascial fibromatosis: Secondary | ICD-10-CM

## 2018-06-05 NOTE — Progress Notes (Signed)
Patient came in today to p/up functional foot orthotics.   The orthotics were assessed to both fit and function.  The F/O addressed the biomechanical issues/pathologies as intended, offering good longitudinal arch support, proper offloading, and foot support. There weren't any signs of discomfort or irritation.  The F/O fit properly in footwear with minimal trimming/adjustments. 

## 2018-06-05 NOTE — Progress Notes (Signed)
Patient picked up orthotics today. Per assistants doing fine no complaints. Did not need to see provider.

## 2018-06-25 ENCOUNTER — Ambulatory Visit: Payer: BLUE CROSS/BLUE SHIELD | Admitting: Gynecology

## 2018-06-25 ENCOUNTER — Other Ambulatory Visit: Payer: BLUE CROSS/BLUE SHIELD

## 2018-06-26 DIAGNOSIS — Z7251 High risk heterosexual behavior: Secondary | ICD-10-CM | POA: Diagnosis not present

## 2018-09-19 ENCOUNTER — Other Ambulatory Visit: Payer: Self-pay | Admitting: Family Medicine

## 2018-09-19 NOTE — Telephone Encounter (Signed)
Last OV 05/12/18, No future OV  Imitrex last filled by Dr. Colin Broach, 01/23/18, # 10 with 3 refills  Zofran last filled 2018--# 20 with 0 refills  Not sure if you are filling either of these? Please advise.

## 2018-09-19 NOTE — Telephone Encounter (Signed)
OK to refill both 

## 2018-11-04 DIAGNOSIS — Z7251 High risk heterosexual behavior: Secondary | ICD-10-CM | POA: Diagnosis not present

## 2019-01-14 ENCOUNTER — Encounter: Payer: Self-pay | Admitting: Gynecology

## 2019-01-14 DIAGNOSIS — Z1231 Encounter for screening mammogram for malignant neoplasm of breast: Secondary | ICD-10-CM | POA: Diagnosis not present

## 2019-01-23 ENCOUNTER — Ambulatory Visit (INDEPENDENT_AMBULATORY_CARE_PROVIDER_SITE_OTHER): Payer: BC Managed Care – PPO | Admitting: Gynecology

## 2019-01-23 ENCOUNTER — Other Ambulatory Visit: Payer: Self-pay

## 2019-01-23 ENCOUNTER — Encounter: Payer: Self-pay | Admitting: Gynecology

## 2019-01-23 VITALS — BP 122/78 | Ht 62.0 in | Wt 128.0 lb

## 2019-01-23 DIAGNOSIS — Z01419 Encounter for gynecological examination (general) (routine) without abnormal findings: Secondary | ICD-10-CM

## 2019-01-23 DIAGNOSIS — N83201 Unspecified ovarian cyst, right side: Secondary | ICD-10-CM

## 2019-01-23 DIAGNOSIS — N951 Menopausal and female climacteric states: Secondary | ICD-10-CM | POA: Diagnosis not present

## 2019-01-23 DIAGNOSIS — G43019 Migraine without aura, intractable, without status migrainosus: Secondary | ICD-10-CM

## 2019-01-23 DIAGNOSIS — N83202 Unspecified ovarian cyst, left side: Secondary | ICD-10-CM

## 2019-01-23 DIAGNOSIS — Z13220 Encounter for screening for lipoid disorders: Secondary | ICD-10-CM

## 2019-01-23 MED ORDER — RIZATRIPTAN BENZOATE 10 MG PO TBDP: ORAL_TABLET | ORAL | 1 refills | Status: DC

## 2019-01-23 MED ORDER — ONDANSETRON 8 MG PO TBDP: ORAL_TABLET | ORAL | 0 refills | Status: DC

## 2019-01-23 MED ORDER — ESTRADIOL 0.5 MG PO TABS: 0.5000 mg | ORAL_TABLET | Freq: Every day | ORAL | 11 refills | Status: DC

## 2019-01-23 MED ORDER — PROGESTERONE MICRONIZED 100 MG PO CAPS: 100.0000 mg | ORAL_CAPSULE | Freq: Every day | ORAL | 11 refills | Status: DC

## 2019-01-23 MED ORDER — SUMATRIPTAN SUCCINATE 100 MG PO TABS: ORAL_TABLET | ORAL | 3 refills | Status: DC

## 2019-01-23 NOTE — Patient Instructions (Signed)
Follow-up for the ultrasound as scheduled.  Start on the hormone replacement.  Call if any issues at all.

## 2019-01-23 NOTE — Progress Notes (Signed)
° ° °  Jamie Cameron Oct 11, 1965 [id number]        53 y.o.  G1P1 for annual gynecologic exam.  Last menstrual period in March with no bleeding since.  Increasing hot flushes, night sweats, sleep disturbances and mood swings.  Past medical history,surgical history, problem list, medications, allergies, family history and social history were all reviewed and documented as reviewed in the EPIC chart.  ROS:  Performed with pertinent positives and negatives included in the history, assessment and plan.   Additional significant findings : None   Exam: Jamie Cameron assistant Vitals:   01/23/19 0953  BP: 122/78  Weight: 128 lb (58.1 kg)  Height: 5\' 2"  (1.575 m)   Body mass index is 23.41 kg/m.  General appearance:  Normal affect, orientation and appearance. Skin: Grossly normal HEENT: Without gross lesions.  No cervical or supraclavicular adenopathy. Thyroid normal.  Lungs:  Clear without wheezing, rales or rhonchi Cardiac: RR, without RMG Abdominal:  Soft, nontender, without masses, guarding, rebound, organomegaly or hernia Breasts:  Examined lying and sitting without masses, retractions, discharge or axillary adenopathy. Pelvic:  Ext, BUS, Vagina: Normal  Cervix: Normal  Uterus: Anteverted, normal size, shape and contour, midline and mobile nontender   Adnexa: Without masses or tenderness    Anus and perineum: Normal   Rectovaginal: Normal sphincter tone without palpated masses or tenderness.    Assessment/Plan:  53 y.o. G1P1 female for annual gynecologic exam.  Vasectomy birth control  1. Perimenopausal.  No bleeding since March.  Increasing menopausal symptoms.  Discussed options to include observation, OTC products, pharmacologic nonhormonal such as Effexor and HRT.  We discussed the HRT issue in detail to include risks versus benefits.  Symptom relief cardiovascular and bone health benefits versus risks to include thrombosis such as stroke heart attack DVT in the breast cancer  issue.  She does have a history of migraines which she thinks are getting a little worse in the perimenopause.  Possible increased risks of stroke associated with estrogen products.  Possible benefits as far as less migraines with HRT/hormonal swings also discussed.  At this point she would like to go ahead and try.  Options of transdermal versus oral with thrombosis risk issues discussed.  We will go ahead and start on estradiol 0.5 mg and Prometrium 100 mg nightly.  Follow-up with response in any issues once starting. 2. Ultrasound 03/2018 with right ovary 40 mm avascular cyst left ovary with 39 mm avascular cyst.  Was recommended to repeat in 3 months.  Repeat ultrasound now for completeness.  Exam is normal without palpable adnexal pathology. 3. History of migraines.  I refilled her Maxalt, Imitrex and Zofran.  Uses Maxalt when she has an acute onset at work versus the Jamie Cameron tracks she uses at home.  Will follow-up if they significantly worsen with neurology referral. 4. Pap smear/HPV 2019.  No Pap smear done today.  No history of abnormal Pap smears previously. 5. Mammography 01/2019.  Continue with annual mammography next year.  Breast exam normal today. 6. Colonoscopy 2009.  Reminded patient she is due now and she is going to plan to schedule beginning of next year. 7. Health maintenance.  CBC, CMP, lipid profile ordered.  TSH due to her menopausal symptoms rule out thyroid dysfunction.  Hepatitis B and QuantiFERON check for work requested.  Follow-up in 1 year for annual exam.   Anastasio Auerbach MD, 11:25 AM 01/23/2019

## 2019-01-26 ENCOUNTER — Encounter: Payer: Self-pay | Admitting: Gynecology

## 2019-01-26 ENCOUNTER — Other Ambulatory Visit: Payer: Self-pay | Admitting: Gynecology

## 2019-01-26 DIAGNOSIS — E7800 Pure hypercholesterolemia, unspecified: Secondary | ICD-10-CM

## 2019-01-26 LAB — COMPREHENSIVE METABOLIC PANEL
AG Ratio: 2.2 (calc) (ref 1.0–2.5)
ALT: 17 U/L (ref 6–29)
AST: 20 U/L (ref 10–35)
Albumin: 4.7 g/dL (ref 3.6–5.1)
Alkaline phosphatase (APISO): 61 U/L (ref 37–153)
BUN: 12 mg/dL (ref 7–25)
CO2: 23 mmol/L (ref 20–32)
Calcium: 9.9 mg/dL (ref 8.6–10.4)
Chloride: 105 mmol/L (ref 98–110)
Creat: 0.71 mg/dL (ref 0.50–1.05)
Globulin: 2.1 g/dL (calc) (ref 1.9–3.7)
Glucose, Bld: 86 mg/dL (ref 65–99)
Potassium: 4.4 mmol/L (ref 3.5–5.3)
Sodium: 141 mmol/L (ref 135–146)
Total Bilirubin: 0.5 mg/dL (ref 0.2–1.2)
Total Protein: 6.8 g/dL (ref 6.1–8.1)

## 2019-01-26 LAB — LIPID PANEL
Cholesterol: 210 mg/dL — ABNORMAL HIGH (ref <200)
HDL: 61 mg/dL (ref >=50)
LDL Cholesterol (Calc): 126 mg/dL (calc) — ABNORMAL HIGH
Non-HDL Cholesterol (Calc): 149 mg/dL (calc) — ABNORMAL HIGH (ref <130)
Total CHOL/HDL Ratio: 3.4 (calc) (ref <5.0)
Triglycerides: 123 mg/dL (ref <150)

## 2019-01-26 LAB — CBC WITH DIFFERENTIAL/PLATELET
Absolute Monocytes: 663 cells/uL (ref 200–950)
Basophils Absolute: 41 cells/uL (ref 0–200)
Basophils Relative: 0.4 %
Eosinophils Absolute: 31 cells/uL (ref 15–500)
Eosinophils Relative: 0.3 %
HCT: 39.8 % (ref 35.0–45.0)
Hemoglobin: 13.6 g/dL (ref 11.7–15.5)
Lymphs Abs: 1550 cells/uL (ref 850–3900)
MCH: 33.4 pg — ABNORMAL HIGH (ref 27.0–33.0)
MCHC: 34.2 g/dL (ref 32.0–36.0)
MCV: 97.8 fL (ref 80.0–100.0)
MPV: 11.9 fL (ref 7.5–12.5)
Monocytes Relative: 6.5 %
Neutro Abs: 7915 cells/uL — ABNORMAL HIGH (ref 1500–7800)
Neutrophils Relative %: 77.6 %
Platelets: 329 10*3/uL (ref 140–400)
RBC: 4.07 10*6/uL (ref 3.80–5.10)
RDW: 12.3 % (ref 11.0–15.0)
Total Lymphocyte: 15.2 %
WBC: 10.2 10*3/uL (ref 3.8–10.8)

## 2019-01-26 LAB — TSH: TSH: 0.99 mIU/L

## 2019-01-26 LAB — HEPATITIS B SURFACE ANTIGEN: Hepatitis B Surface Ag: NONREACTIVE

## 2019-02-03 ENCOUNTER — Other Ambulatory Visit: Payer: Self-pay | Admitting: Gynecology

## 2019-02-03 ENCOUNTER — Encounter: Payer: Self-pay | Admitting: Gynecology

## 2019-02-03 DIAGNOSIS — Z111 Encounter for screening for respiratory tuberculosis: Secondary | ICD-10-CM

## 2019-02-03 NOTE — Addendum Note (Signed)
Addended by: Joaquin Music on: 02/03/2019 04:23 PM   Modules accepted: Orders

## 2019-02-18 NOTE — Telephone Encounter (Signed)
I called patient and per DPR access note on file I left detailed message reading her Dr. Dorette Grate note with her. I did advise she call for lab appointment instead of "dropping by at any time".

## 2019-02-25 ENCOUNTER — Encounter: Payer: Self-pay | Admitting: Gynecology

## 2019-03-03 ENCOUNTER — Ambulatory Visit (INDEPENDENT_AMBULATORY_CARE_PROVIDER_SITE_OTHER): Payer: BC Managed Care – PPO | Admitting: Gynecology

## 2019-03-03 ENCOUNTER — Encounter: Payer: Self-pay | Admitting: Gynecology

## 2019-03-03 ENCOUNTER — Other Ambulatory Visit: Payer: Self-pay

## 2019-03-03 ENCOUNTER — Ambulatory Visit (INDEPENDENT_AMBULATORY_CARE_PROVIDER_SITE_OTHER): Payer: BC Managed Care – PPO

## 2019-03-03 VITALS — BP 118/76

## 2019-03-03 DIAGNOSIS — N83202 Unspecified ovarian cyst, left side: Secondary | ICD-10-CM

## 2019-03-03 DIAGNOSIS — N83201 Unspecified ovarian cyst, right side: Secondary | ICD-10-CM

## 2019-03-03 DIAGNOSIS — R1909 Other intra-abdominal and pelvic swelling, mass and lump: Secondary | ICD-10-CM | POA: Diagnosis not present

## 2019-03-03 NOTE — Progress Notes (Addendum)
    Jamie Cameron 52/16/1967 [id number]        53 y.o.  G1P1 presents for follow-up ultrasound.  Had CT scan last year which showed a 3.7 cm left ovarian cyst.  She had a follow-up ultrasound which showed a 40 x 38 x 29 mm echo-free avascular right ovarian cyst and a 33 x 39 x 34 mm left ovarian cyst.  Was recommended to follow-up and recheck and presents for that now.  Past medical history,surgical history, problem list, medications, allergies, family history and social history were all reviewed and documented in the EPIC chart.  Directed ROS with pertinent positives and negatives documented in the history of present illness/assessment and plan.  Exam: Vitals:   [birth year] 1026  BP: 118/76   General appearance:  Normal  Ultrasound transvaginal shows uterus normal size and echotexture.  Endometrial echo 2.5 mm.  Right ovary small with no evidence of the prior cyst.  Left ovary with 3 thin-walled avascular simple appearing cysts measuring average 3.49 cm, 2.26 cm and 1.84 cm.  Cul-de-sac negative.  Assessment/Plan:  53 y.o. G1P1 with resolved right ovarian cyst.  Now with 3 simple small avascular left ovarian cysts.  We discussed the differential to include physiologic, benign pathologic such as serous cystadenoma and ovarian cancer.  She is asymptomatic without pain.  Options for management to include observation versus surgery discussed.  At this point the patient is comfortable with observation.  After discussing the pitfalls of CA 125 to include false positive and false negative results we will go ahead and check a baseline now.  If negative then plan follow-up ultrasound in 6 months for recheck.  Also of note when I last saw her we discussed HRT and she was going to start estradiol and Prometrium due to menopausal symptoms.  Several weeks later her symptoms had resolved without starting the medication.  We discussed again HRT, risks versus benefits and at this point the patient prefers to hold  on medication.  We discussed thrombosis risks and the breast cancer issue versus possible cardiovascular and bone health benefits.    Anastasio Auerbach MD, 10:51 AM 03/03/2019

## 2019-03-03 NOTE — Patient Instructions (Signed)
Office will call with the blood test results.  Follow-up for repeat ultrasound in 6 months

## 2019-03-04 ENCOUNTER — Encounter: Payer: Self-pay | Admitting: Gynecology

## 2019-03-04 LAB — CA 125: CA 125: 6 U/mL (ref <35)

## 2019-03-09 DIAGNOSIS — Z7251 High risk heterosexual behavior: Secondary | ICD-10-CM | POA: Diagnosis not present

## 2019-05-25 ENCOUNTER — Telehealth: Payer: Self-pay | Admitting: *Deleted

## 2019-05-25 NOTE — Telephone Encounter (Signed)
Immunization record from charted printed and up front for patient. Copied from [place] [id number]. Topic: General - Other >> May 25, 2019  2:15 PM Greggory Keen D wrote: Pt called requesting a copy of her immunization records.  She will be picking up a copy of her sons' on Dec 23 and would like to pick hers up at the same time.  CB#  [phone]

## 2019-07-06 ENCOUNTER — Telehealth: Payer: Self-pay | Admitting: *Deleted

## 2019-07-06 NOTE — Telephone Encounter (Signed)
Had annual exam on 01/23/19, patient called requesting refill on rizatriptan ODT 10 mg tablets. Last filled on 01/23/19 #10 tablets with 1 refill. Okay to refill?

## 2019-07-07 MED ORDER — RIZATRIPTAN BENZOATE 10 MG PO TBDP: ORAL_TABLET | ORAL | 0 refills | Status: DC

## 2019-07-07 NOTE — Telephone Encounter (Signed)
Left message for patient to call.

## 2019-07-07 NOTE — Telephone Encounter (Signed)
Patient informed, Rx sent to pharmacy.

## 2019-07-07 NOTE — Telephone Encounter (Signed)
Yes, that would be fine.

## 2019-08-19 DIAGNOSIS — Z7251 High risk heterosexual behavior: Secondary | ICD-10-CM | POA: Diagnosis not present

## 2019-09-16 DIAGNOSIS — Z7251 High risk heterosexual behavior: Secondary | ICD-10-CM | POA: Diagnosis not present

## 2020-02-03 ENCOUNTER — Other Ambulatory Visit: Payer: Self-pay | Admitting: *Deleted

## 2020-02-03 NOTE — Telephone Encounter (Signed)
I'm just not familiar with that usage and I would say pick one or the other or maybe we have her consult with neurology. I would imagine it is not recommended to use both within a certain timeframe of each other.  Thank you

## 2020-02-03 NOTE — Telephone Encounter (Signed)
Did Dr Velvet Bathe put her on both of those? I'm not sure about using them together or alternating.

## 2020-02-03 NOTE — Telephone Encounter (Signed)
Patient called and left message requesting refill on Maxalt- 10 mg tablet and imitrex 100 mg tablet reports she takes both depending on how bad her migraines are. 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 I will discuss with her scheduling annual exam. I have medication pending. Please advise

## 2020-02-03 NOTE — Telephone Encounter (Signed)
Per note on 01/23/19 office visit   "History of migraines.  I refilled her Maxalt, Imitrex and Zofran.  Uses Maxalt when she has an acute onset at work versus the [employer] tracks she uses at home.  Will follow-up if they significantly worsen with neurology referral."

## 2020-02-04 MED ORDER — ONDANSETRON 8 MG PO TBDP: ORAL_TABLET | ORAL | 0 refills | Status: DC

## 2020-02-04 MED ORDER — SUMATRIPTAN SUCCINATE 100 MG PO TABS: ORAL_TABLET | ORAL | 3 refills | Status: DC

## 2020-02-04 NOTE — Telephone Encounter (Signed)
Patient informed with below note, she would pick the imitrex 100 mg tablet, she also mentioned a Rx for Zofran 8 mg reports sometimes when she takes the Imitrex she gets nauseated. Please advise

## 2020-02-24 DIAGNOSIS — Z23 Encounter for immunization: Secondary | ICD-10-CM | POA: Diagnosis not present

## 2020-02-24 DIAGNOSIS — Z111 Encounter for screening for respiratory tuberculosis: Secondary | ICD-10-CM | POA: Diagnosis not present

## 2020-05-20 ENCOUNTER — Ambulatory Visit (INDEPENDENT_AMBULATORY_CARE_PROVIDER_SITE_OTHER): Payer: BC Managed Care – PPO | Admitting: Family Medicine

## 2020-05-20 ENCOUNTER — Encounter: Payer: Self-pay | Admitting: Family Medicine

## 2020-05-20 ENCOUNTER — Other Ambulatory Visit: Payer: Self-pay

## 2020-05-20 VITALS — BP 116/80 | HR 80 | Temp 97.7°F | Ht 62.0 in

## 2020-05-20 DIAGNOSIS — R55 Syncope and collapse: Secondary | ICD-10-CM | POA: Diagnosis not present

## 2020-05-20 DIAGNOSIS — R030 Elevated blood-pressure reading, without diagnosis of hypertension: Secondary | ICD-10-CM | POA: Diagnosis not present

## 2020-05-20 NOTE — Progress Notes (Signed)
Established Patient Office Visit  Subjective:  Patient ID: Jamie Cameron, female    DOB: 09-Apr-1966  Age: 54 y.o. MRN: [id number]  CC:  Chief Complaint  Patient presents with  . Hypertension    BP reading of 173/109 and higher past 2 weeks  . Loss of Consciousness    Complains of dizziness, passed out and did not go to the ER    HPI Jamie Cameron presents for some recent dizziness along with elevated blood pressure readings and near syncopal episode this past Monday.  She states she has had episodes at work where she felt dizzy but has not had any true syncope.  This past Monday she was standing and talking with her mother when she felt very dizzy as if she were going to pass out.  Her mother helped assist her to a chair.  There was no actual loss of consciousness.  She is not aware of any palpitations.  She is not aware of any pain at this time.  She took her blood pressure and apparently got reading of 173/109 with pulse in the 90s which is atypical for her.  She does not have any history of hypertension.  She does have positive family history.  No recent chest pains.  She has recently noticed some episodes where she squats down or leans over and feels dizzy.  This is more of a lightheadedness.  No vertigo.  She does drink a lot of caffeine -usually about 5 cups of coffee per day but drinks a fair amount of water as well.  She states she had echo remotely about 10 years ago reportedly normal.  No recent EKG.  She had episode several years ago where potassium got down to 2.4 where she felt very similar.  There is no concern for recent seizure activity.  She does not take any diuretics.  Past Medical History:  Diagnosis Date  . Allergy   . Asthma   . Endometriosis   . Hypokalemia   . Migraine     Past Surgical History:  Procedure Laterality Date  . BREAST SURGERY  1999   Left benign lymph node  . CHOLECYSTECTOMY    . FOOT SURGERY    . PELVIC LAPAROSCOPY  1996    endometriosis    Family History  Problem Relation Age of Onset  . Hypertension Mother   . Diabetes Mother   . Heart attack Mother   . Hypertension Father   . Breast cancer Maternal Aunt 70  . Cancer Maternal Grandmother        cervical  . Breast cancer Maternal Grandmother        70's  . Diabetes Maternal Grandfather   . Breast cancer Paternal Aunt 21    Social History   Socioeconomic History  . Marital status: Married    Spouse name: Not on file  . Number of children: Not on file  . Years of education: Not on file  . Highest education level: Not on file  Occupational History  . Not on file  Tobacco Use  . Smoking status: Never Smoker  . Smokeless tobacco: Never Used  Vaping Use  . Vaping Use: Never used  Substance and Sexual Activity  . Alcohol use: No    Alcohol/week: 0.0 standard drinks  . Drug use: No  . Sexual activity: Yes    Birth control/protection: Other-see comments    Comment: vasectomy-1st intercourse 54 yo-Fewer than 5 partners  Other Topics Concern  . Not  on file  Social History Narrative  . Not on file   Social Determinants of Health   Financial Resource Strain: Not on file  Food Insecurity: Not on file  Transportation Needs: Not on file  Physical Activity: Not on file  Stress: Not on file  Social Connections: Not on file  Intimate Partner Violence: Not on file    Outpatient Medications Prior to Visit  Medication Sig Dispense Refill  . acetaminophen (TYLENOL) 500 MG tablet Take 500 mg by mouth every 6 (six) hours as needed.    . Calcium Carbonate-Vitamin D (CALCIUM + D PO) Take by mouth.    . chlorpheniramine-HYDROcodone (TUSSIONEX PENNKINETIC ER) 10-8 MG/5ML SUER Take 5 mLs by mouth every 12 (twelve) hours as needed for cough. 120 mL 0  . DESCOVY 200-25 MG tablet   3  . diclofenac (VOLTAREN) 50 MG EC tablet Take 1 tablet by mouth once at onset headache 8 tablet 4  . estradiol (ESTRACE) 0.5 MG tablet Take 1 tablet (0.5 mg total) by mouth  daily. 30 tablet 11  . ibuprofen (ADVIL,MOTRIN) 800 MG tablet Take 1 tablet (800 mg total) by mouth every 8 (eight) hours as needed. 30 tablet 1  . NASONEX 50 MCG/ACT nasal spray as needed.    . ondansetron (ZOFRAN-ODT) 8 MG disintegrating tablet DISSOLVE 1 TABLET(8 MG) ON THE TONGUE EVERY 8 HOURS AS NEEDED FOR NAUSEA OR VOMITING 20 tablet 0  . PROAIR HFA 108 (90 BASE) MCG/ACT inhaler as needed.    . progesterone (PROMETRIUM) 100 MG capsule Take 1 capsule (100 mg total) by mouth at bedtime. 30 capsule 11  . rizatriptan (MAXALT-MLT) 10 MG disintegrating tablet DISSOLVE 1 TABLET BY MOUTH AS NEEDED FOR MIGRAINE, MAY REPEAT IN 2 HOURS IF NEEDED 10 tablet 0  . SUMAtriptan (IMITREX) 100 MG tablet TAKE 1 TABLET BY MOUTH EVERY 2 HOURS AS NEEDED FOR MIGRAINE. MAY REPEAT IN 2 HOURS IF HEADACHE PERSISTS OR RECURS 10 tablet 3  . SYMBICORT 80-4.5 MCG/ACT inhaler Inhale 2 puffs into the lungs 2 (two) times daily.     No facility-administered medications prior to visit.    Allergies  Allergen Reactions  . Latex Hives, Shortness Of Breath, Itching, Swelling and Rash  . Sulfacetamide Sodium Nausea And Vomiting  . Prednisone Nausea And Vomiting    ROS Review of Systems  Constitutional: Negative for chills and fever.  Respiratory: Negative for shortness of breath.   Cardiovascular: Negative for chest pain and palpitations.  Neurological: Positive for dizziness. Negative for seizures, speech difficulty and weakness.  Psychiatric/Behavioral: Negative for confusion.      Objective:    Physical Exam Vitals reviewed.  Constitutional:      Appearance: Normal appearance.  Neck:     Comments: No carotid bruits Cardiovascular:     Rate and Rhythm: Normal rate and regular rhythm.     Heart sounds: No murmur heard.   Pulmonary:     Effort: Pulmonary effort is normal.     Breath sounds: Normal breath sounds.  Musculoskeletal:     Right lower leg: No edema.     Left lower leg: No edema.   Neurological:     General: No focal deficit present.     Mental Status: She is alert.     Cranial Nerves: No cranial nerve deficit.     BP 116/80 (BP Location: Left Arm, Patient Position: Sitting, Cuff Size: Normal)   Pulse 80   Temp 97.7 F (36.5 C) (Oral)   Ht 5\' 2"  (  1.575 m)   SpO2 97%   BMI 23.41 kg/m  Wt Readings from Last 3 Encounters:  01/23/19 128 lb (58.1 kg)  05/12/18 128 lb 12.5 oz (58.4 kg)  01/20/18 129 lb (58.5 kg)     Health Maintenance Due  Topic Date Due  . COLONOSCOPY  Never done    There are no preventive care reminders to display for this patient.  Lab Results  Component Value Date   TSH 0.99 01/23/2019   Lab Results  Component Value Date   WBC 10.2 01/23/2019   HGB 13.6 01/23/2019   HCT 39.8 01/23/2019   MCV 97.8 01/23/2019   PLT 329 01/23/2019   Lab Results  Component Value Date   NA 141 01/23/2019   K 4.4 01/23/2019   CO2 23 01/23/2019   GLUCOSE 86 01/23/2019   BUN 12 01/23/2019   CREATININE 0.71 01/23/2019   BILITOT 0.5 01/23/2019   ALKPHOS 59 07/03/2017   AST 20 01/23/2019   ALT 17 01/23/2019   PROT 6.8 01/23/2019   ALBUMIN 4.3 07/03/2017   CALCIUM 9.9 01/23/2019   GFR 129.63 04/02/2017   Lab Results  Component Value Date   CHOL 210 (H) 01/23/2019   Lab Results  Component Value Date   HDL 61 01/23/2019   Lab Results  Component Value Date   LDLCALC 126 (H) 01/23/2019   Lab Results  Component Value Date   TRIG 123 01/23/2019   Lab Results  Component Value Date   CHOLHDL 3.4 01/23/2019   No results found for: HGBA1C    Assessment & Plan:   Patient relates episode recently of near syncope and several other episodes of dizziness not quite as severe.  She also describes recent transient elevated blood pressure with relatively normal blood pressure today.  Blood pressure today seated 128/88 and standing 122/88  -EKG obtained which shows sinus rhythm with no acute findings -Check labs with CBC, TSH, comprehensive  metabolic panel -Pending results above consider echocardiogram and possible cardiac event monitor -Recommend she continue to monitor blood pressure and be in touch if she has any blood pressure spikes.  No orders of the defined types were placed in this encounter.   Follow-up: No follow-ups on file.    Evelena Peat, MD

## 2020-05-20 NOTE — Patient Instructions (Signed)
Near-Syncope Near-syncope is when you suddenly feel like you might pass out (faint), but you do not actually lose consciousness. This may also be referred to as presyncope. During an episode of near-syncope, you may:  Feel dizzy, weak, or light-headed.  Feel nauseous.  See all white or all black in your field of vision, or see spots.  Have cold, clammy skin. This condition is caused by a sudden decrease in blood flow to the brain. This decrease can result from various causes, but most of those causes are not dangerous. However, near-syncope may be a sign of a serious medical problem, so it is important to seek medical care. Follow these instructions at home: Medicines  Take over-the-counter and prescription medicines only as told by your health care provider.  If you are taking blood pressure or heart medicine, get up slowly and take several minutes to sit and then stand. This can reduce dizziness. General instructions  Pay attention to any changes in your symptoms.  Talk with your health care provider about your symptoms. You may need to have testing to understand the cause of your near-syncope.  If you start to feel like you might faint, lie down right away and raise (elevate) your feet above the level of your heart. Breathe deeply and steadily. Wait until all of the symptoms have passed.  Have someone stay with you until you feel stable.  Do not drive, use machinery, or play sports until your health care provider says it is okay.  Drink enough fluid to keep your urine pale yellow.  Keep all follow-up visits as told by your health care provider. This is important. Get help right away if you:  Have a seizure.  Have unusual pain in your chest, abdomen, or back.  Faint once or repeatedly.  Have a severe headache.  Are bleeding from your mouth or rectum, or you have black or tarry stool.  Have a very fast or irregular heartbeat (palpitations).  Are confused.  Have  trouble walking.  Have severe weakness.  Have vision problems. These symptoms may represent a serious problem that is an emergency. Do not wait to see if your symptoms will go away. Get medical help right away. Call your local emergency services (911 in the [place] Do not drive yourself to the hospital. Summary  Near-syncope is when you suddenly feel like you might pass out (faint), but you do not actually lose consciousness.  This condition is caused by a sudden decrease in blood flow to the brain. This decrease can result from various causes, but most of those causes are not dangerous.  Near-syncope may be a sign of a serious medical problem, so it is important to seek medical care. This information is not intended to replace advice given to you by your health care provider. Make sure you discuss any questions you have with your health care provider. Document Revised: 09/12/2018 Document Reviewed: 04/09/2018 Elsevier Patient Education  2020 [employer] Inc[employer]  

## 2020-05-21 LAB — CBC WITH DIFFERENTIAL/PLATELET
Absolute Monocytes: 444 cells/uL (ref 200–950)
Basophils Absolute: 37 cells/uL (ref 0–200)
Basophils Relative: 0.5 %
Eosinophils Absolute: 81 cells/uL (ref 15–500)
Eosinophils Relative: 1.1 %
HCT: 42 % (ref 35.0–45.0)
Hemoglobin: 14.1 g/dL (ref 11.7–15.5)
Lymphs Abs: 2168 cells/uL (ref 850–3900)
MCH: 31.2 pg (ref 27.0–33.0)
MCHC: 33.6 g/dL (ref 32.0–36.0)
MCV: 92.9 fL (ref 80.0–100.0)
MPV: 11.8 fL (ref 7.5–12.5)
Monocytes Relative: 6 %
Neutro Abs: 4669 cells/uL (ref 1500–7800)
Neutrophils Relative %: 63.1 %
Platelets: 346 10*3/uL (ref 140–400)
RBC: 4.52 10*6/uL (ref 3.80–5.10)
RDW: 11.8 % (ref 11.0–15.0)
Total Lymphocyte: 29.3 %
WBC: 7.4 10*3/uL (ref 3.8–10.8)

## 2020-05-21 LAB — COMPREHENSIVE METABOLIC PANEL
AG Ratio: 2 (calc) (ref 1.0–2.5)
ALT: 13 U/L (ref 6–29)
AST: 16 U/L (ref 10–35)
Albumin: 4.5 g/dL (ref 3.6–5.1)
Alkaline phosphatase (APISO): 60 U/L (ref 37–153)
BUN: 9 mg/dL (ref 7–25)
CO2: 29 mmol/L (ref 20–32)
Calcium: 9.9 mg/dL (ref 8.6–10.4)
Chloride: 105 mmol/L (ref 98–110)
Creat: 0.74 mg/dL (ref 0.50–1.05)
Globulin: 2.3 g/dL (calc) (ref 1.9–3.7)
Glucose, Bld: 69 mg/dL (ref 65–99)
Potassium: 4.8 mmol/L (ref 3.5–5.3)
Sodium: 144 mmol/L (ref 135–146)
Total Bilirubin: 0.3 mg/dL (ref 0.2–1.2)
Total Protein: 6.8 g/dL (ref 6.1–8.1)

## 2020-05-21 LAB — TSH: TSH: 1.22 mIU/L

## 2020-05-21 NOTE — Addendum Note (Signed)
Addended by: Kristian Covey on: 05/21/2020 02:09 PM   Modules accepted: Orders

## 2020-05-22 ENCOUNTER — Encounter: Payer: Self-pay | Admitting: Family Medicine

## 2020-07-15 ENCOUNTER — Other Ambulatory Visit (HOSPITAL_COMMUNITY): Payer: BC Managed Care – PPO

## 2020-08-08 ENCOUNTER — Other Ambulatory Visit (HOSPITAL_COMMUNITY): Payer: BC Managed Care – PPO

## 2020-08-31 ENCOUNTER — Other Ambulatory Visit (HOSPITAL_COMMUNITY): Payer: BC Managed Care – PPO

## 2020-08-31 ENCOUNTER — Encounter (HOSPITAL_COMMUNITY): Payer: Self-pay | Admitting: Family Medicine

## 2020-08-31 ENCOUNTER — Encounter (HOSPITAL_COMMUNITY): Payer: Self-pay

## 2020-08-31 NOTE — Progress Notes (Signed)
Verified appointment "no show" status with A. Martinez at 10:30.

## 2020-09-13 ENCOUNTER — Telehealth (HOSPITAL_COMMUNITY): Payer: Self-pay | Admitting: Family Medicine

## 2020-09-13 NOTE — Telephone Encounter (Signed)
Just an FYI. We have made several attempts to contact this patient including sending a letter to schedule or reschedule their echocardiogram. We will be removing the patient from the echo WQ.   08/31/20 NO SHOWED-MAILED LETTER/LBW  06/21/20 [facility] to schedule @ 8:51/LBW  06/17/20 [facility] to schedule @ 11:58/LBW     Thank you

## 2020-11-07 NOTE — Progress Notes (Signed)
55 y.o. G1P1 Divorced caucasian/[place] female here for annual exam.    States she has never had a normal menstrual period, sporadic since age 39. Hx endometriosis and has done 4 surgeries.   No menses for 3 years.  Having hot flashes and night sweats.  FSH 15.4 on 01/20/18.  She has been followed for a left ovary with simple cysts:  3.49 cm, 2.26 cm, and 1.84 cm on [place] 03/03/19.  CA125 was 6.   Desires complete labs.  Notices weight change.  Vegetarian, vegan essentially.   Migraines are worsening.  Needs refills of Maxalt and Immitrex.  Also needs Zofran for nausea when the migraine is significant.   New partner for the last year.   UPT - neg.   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   PCP:   Evelena Peat, MD  No LMP recorded (lmp unknown). (Menstrual status: Irregular Periods).     Period Pattern:  (no cycle in 25yrs)     Sexually active: Yes.    The current method of family planning is none.    Exercising: Yes.     Running & trainer Smoker:  no  Health Maintenance: Pap:  01-20-18 neg HPV HR neg, 08-31-14 neg History of abnormal Pap:  no MMG: 01-14-2019 category c density birads 1:neg Colonoscopy:  2009.  She will schedule this.  BMD:   none  Result  n/a TDaP:  2022 Gardasil:   no HIV: neg 2021 per patient Hep C: neg per patient 2021 Screening Labs:  PCP.    reports that she has never smoked. She has never used smokeless tobacco. She reports that she does not drink alcohol and does not use drugs.  Past Medical History:  Diagnosis Date   Allergy    Asthma    COVID-19 virus infection    Endometriosis    Hypokalemia    Left ovarian cyst    See Epic pelvic US 03/03/19.   Migraine    Pneumonia     Past Surgical History:  Procedure Laterality Date   BREAST SURGERY  1999   Left benign lymph node   CHOLECYSTECTOMY     FOOT SURGERY     PELVIC LAPAROSCOPY  1996   endometriosis    Current Outpatient Medications  Medication Sig Dispense  Refill   acetaminophen (TYLENOL) 500 MG tablet Take 500 mg by mouth every 6 (six) hours as needed.     Calcium Carbonate-Vitamin D (CALCIUM + D PO) Take by mouth.     ibuprofen (ADVIL,MOTRIN) 800 MG tablet Take 1 tablet (800 mg total) by mouth every 8 (eight) hours as needed. 30 tablet 1   ondansetron (ZOFRAN-ODT) 8 MG disintegrating tablet DISSOLVE 1 TABLET(8 MG) ON THE TONGUE EVERY 8 HOURS AS NEEDED FOR NAUSEA OR VOMITING 20 tablet 0   PROAIR HFA 108 (90 BASE) MCG/ACT inhaler as needed.     rizatriptan (MAXALT-MLT) 10 MG disintegrating tablet DISSOLVE 1 TABLET BY MOUTH AS NEEDED FOR MIGRAINE, MAY REPEAT IN 2 HOURS IF NEEDED 10 tablet 0   SUMAtriptan (IMITREX) 100 MG tablet TAKE 1 TABLET BY MOUTH EVERY 2 HOURS AS NEEDED FOR MIGRAINE. MAY REPEAT IN 2 HOURS IF HEADACHE PERSISTS OR RECURS 10 tablet 3   SYMBICORT 80-4.5 MCG/ACT inhaler Inhale 2 puffs into the lungs 2 (two) times daily.     No current facility-administered medications for this visit.    Family History  Problem Relation Age of Onset   Hypertension Mother  Diabetes Mother    Heart attack Mother    Hypertension Father    Breast cancer Maternal Aunt 28   Cancer Maternal Grandmother        cervical   Breast cancer Maternal Grandmother        70's   Diabetes Maternal Grandfather    Breast cancer Paternal Aunt 79    Review of Systems  Constitutional:        Hot flashes, migraines, no cycle  HENT: Negative.    Eyes: Negative.   Respiratory: Negative.    Cardiovascular: Negative.   Gastrointestinal: Negative.   Endocrine: Negative.   Genitourinary: Negative.   Musculoskeletal: Negative.   Skin: Negative.   Allergic/Immunologic: Negative.   Neurological: Negative.   Hematological: Negative.   Psychiatric/Behavioral: Negative.     Exam:   BP 102/64   Pulse 73   Resp 16   Ht 5\' 1"  (1.549 m)   Wt 127 lb (57.6 kg)   LMP  (LMP Unknown) Comment: maybe 2 yrs ago for last cycle  BMI 24.00 kg/m     General  appearance: alert, cooperative and appears stated age Head: normocephalic, without obvious abnormality, atraumatic Neck: no adenopathy, supple, symmetrical, trachea midline and thyroid normal to inspection and palpation Lungs: clear to auscultation bilaterally Breasts: normal appearance, no masses or tenderness, No nipple retraction or dimpling, No nipple discharge or bleeding, No axillary adenopathy Heart: regular rate and rhythm Abdomen: soft, non-tender; no masses, no organomegaly Extremities: extremities normal, atraumatic, no cyanosis or edema Skin: skin color, texture, turgor normal. No rashes or lesions Lymph nodes: cervical, supraclavicular, and axillary nodes normal. Neurologic: grossly normal  Pelvic: External genitalia:  no lesions              No abnormal inguinal nodes palpated.              Urethra:  normal appearing urethra with no masses, tenderness or lesions              Bartholins and Skenes: normal                 Vagina: normal appearing vagina with normal color and discharge, no lesions              Cervix: no lesions              Pap taken: Yes.   Bimanual Exam:  Uterus:  normal size, contour, position, consistency, mobility, non-tender              Adnexa: no mass, fullness, tenderness              Rectal exam: Yes.  .  Confirms.              Anus:  normal sphincter tone, no lesions  Chaperone was present for exam.  Assessment:   Well woman visit with normal exam. Left ovarian cysts.  Not palpable on exam. Migraine HAs. Menopausal symptoms.  FH breast cancer.  Plan: Mammogram screening discussed.  She will update this.  Self breast awareness reviewed. Pap and HR HPV as above. Guidelines for Calcium, Vitamin D, regular exercise program including cardiovascular and weight bearing exercise. We discussed HRT, Gabapentin, SSRI/SNRI, and Estroven tx for menopausal symptoms.   We reviewed WHI and potential increased increased risk of stroke, MI, DVT, PE, and  breast cancer if taking HRT.  She may want HRT after her mammogram is back.  Will check FSH, estradiol, TSH, lipids, CMP, CBC and do  STD screening. Refill of Immitrex, Maxalt and Zofran.  Follow up annually and prn.

## 2020-11-10 ENCOUNTER — Other Ambulatory Visit: Payer: Self-pay

## 2020-11-10 ENCOUNTER — Encounter: Payer: Self-pay | Admitting: Obstetrics and Gynecology

## 2020-11-10 ENCOUNTER — Ambulatory Visit (INDEPENDENT_AMBULATORY_CARE_PROVIDER_SITE_OTHER): Payer: BC Managed Care – PPO | Admitting: Obstetrics and Gynecology

## 2020-11-10 ENCOUNTER — Other Ambulatory Visit (HOSPITAL_COMMUNITY)
Admission: RE | Admit: 2020-11-10 | Discharge: 2020-11-10 | Disposition: A | Discharge status: Home / Self Care | Payer: BC Managed Care – PPO | Source: Ambulatory Visit | Attending: Obstetrics and Gynecology | Admitting: Obstetrics and Gynecology

## 2020-11-10 VITALS — BP 102/64 | HR 73 | Resp 16 | Ht 61.0 in | Wt 127.0 lb

## 2020-11-10 DIAGNOSIS — Z01419 Encounter for gynecological examination (general) (routine) without abnormal findings: Secondary | ICD-10-CM | POA: Insufficient documentation

## 2020-11-10 DIAGNOSIS — Z7251 High risk heterosexual behavior: Secondary | ICD-10-CM | POA: Insufficient documentation

## 2020-11-10 DIAGNOSIS — N912 Amenorrhea, unspecified: Secondary | ICD-10-CM

## 2020-11-10 DIAGNOSIS — Z113 Encounter for screening for infections with a predominantly sexual mode of transmission: Secondary | ICD-10-CM | POA: Diagnosis not present

## 2020-11-10 DIAGNOSIS — N951 Menopausal and female climacteric states: Secondary | ICD-10-CM

## 2020-11-10 LAB — CBC
HCT: 41.4 % (ref 35.0–45.0)
Hemoglobin: 13.7 g/dL (ref 11.7–15.5)
MCH: 30.6 pg (ref 27.0–33.0)
MCHC: 33.1 g/dL (ref 32.0–36.0)
MCV: 92.6 fL (ref 80.0–100.0)
MPV: 11.6 fL (ref 7.5–12.5)
Platelets: 316 10*3/uL (ref 140–400)
RBC: 4.47 10*6/uL (ref 3.80–5.10)
RDW: 13 % (ref 11.0–15.0)
WBC: 6.8 10*3/uL (ref 3.8–10.8)

## 2020-11-10 LAB — PREGNANCY, URINE: Preg Test, Ur: NEGATIVE

## 2020-11-10 MED ORDER — SUMATRIPTAN SUCCINATE 100 MG PO TABS: ORAL_TABLET | ORAL | 2 refills | Status: AC

## 2020-11-10 MED ORDER — ONDANSETRON 8 MG PO TBDP: ORAL_TABLET | ORAL | 0 refills | Status: DC

## 2020-11-10 MED ORDER — RIZATRIPTAN BENZOATE 10 MG PO TBDP: ORAL_TABLET | ORAL | 2 refills | Status: AC

## 2020-11-10 NOTE — Addendum Note (Signed)
Addended by: Ardell Isaacs, Debbe Bales E on: 11/10/2020 09:20 AM   Modules accepted: Orders

## 2020-11-10 NOTE — Patient Instructions (Signed)

## 2020-11-11 ENCOUNTER — Encounter: Payer: Self-pay | Admitting: Family Medicine

## 2020-11-11 DIAGNOSIS — Z1231 Encounter for screening mammogram for malignant neoplasm of breast: Secondary | ICD-10-CM | POA: Diagnosis not present

## 2020-11-11 DIAGNOSIS — L218 Other seborrheic dermatitis: Secondary | ICD-10-CM | POA: Diagnosis not present

## 2020-11-11 DIAGNOSIS — L308 Other specified dermatitis: Secondary | ICD-10-CM | POA: Diagnosis not present

## 2020-11-11 LAB — CYTOLOGY - PAP
Chlamydia: NEGATIVE
Comment: NEGATIVE
Comment: NEGATIVE
Comment: NEGATIVE
Comment: NORMAL
Diagnosis: NEGATIVE
High risk HPV: NEGATIVE
Neisseria Gonorrhea: NEGATIVE
Trichomonas: NEGATIVE

## 2020-11-11 LAB — COMPREHENSIVE METABOLIC PANEL
AG Ratio: 2.2 (calc) (ref 1.0–2.5)
ALT: 14 U/L (ref 6–29)
AST: 17 U/L (ref 10–35)
Albumin: 4.6 g/dL (ref 3.6–5.1)
Alkaline phosphatase (APISO): 69 U/L (ref 37–153)
BUN: 17 mg/dL (ref 7–25)
CO2: 30 mmol/L (ref 20–32)
Calcium: 9.6 mg/dL (ref 8.6–10.4)
Chloride: 102 mmol/L (ref 98–110)
Creat: 0.74 mg/dL (ref 0.50–1.05)
Globulin: 2.1 g/dL (calc) (ref 1.9–3.7)
Glucose, Bld: 70 mg/dL (ref 65–99)
Potassium: 3.5 mmol/L (ref 3.5–5.3)
Sodium: 142 mmol/L (ref 135–146)
Total Bilirubin: 0.3 mg/dL (ref 0.2–1.2)
Total Protein: 6.7 g/dL (ref 6.1–8.1)

## 2020-11-11 LAB — HM MAMMOGRAPHY

## 2020-11-11 LAB — HEPATITIS C ANTIBODY
Hepatitis C Ab: NONREACTIVE
SIGNAL TO CUT-OFF: 0 (ref <1.00)

## 2020-11-11 LAB — LIPID PANEL
Cholesterol: 216 mg/dL — ABNORMAL HIGH (ref <200)
HDL: 70 mg/dL (ref >=50)
LDL Cholesterol (Calc): 126 mg/dL (calc) — ABNORMAL HIGH
Non-HDL Cholesterol (Calc): 146 mg/dL (calc) — ABNORMAL HIGH (ref <130)
Total CHOL/HDL Ratio: 3.1 (calc) (ref <5.0)
Triglycerides: 102 mg/dL (ref <150)

## 2020-11-11 LAB — ESTRADIOL: Estradiol: <15 pg/mL

## 2020-11-11 LAB — HIV ANTIBODY (ROUTINE TESTING W REFLEX): HIV 1&2 Ab, 4th Generation: NONREACTIVE

## 2020-11-11 LAB — HEPATITIS B SURFACE ANTIGEN: Hepatitis B Surface Ag: NONREACTIVE

## 2020-11-11 LAB — TSH: TSH: 1.1 mIU/L

## 2020-11-11 LAB — RPR: RPR Ser Ql: NONREACTIVE

## 2020-11-11 LAB — FOLLICLE STIMULATING HORMONE: FSH: 106.2 m[IU]/mL

## 2020-11-15 ENCOUNTER — Encounter: Payer: Self-pay | Admitting: Family Medicine

## 2020-11-15 ENCOUNTER — Other Ambulatory Visit: Payer: Self-pay

## 2020-11-15 DIAGNOSIS — N83202 Unspecified ovarian cyst, left side: Secondary | ICD-10-CM

## 2020-11-22 NOTE — Progress Notes (Deleted)
GYNECOLOGY  VISIT   HPI: 55 y.o.   Married  Caucasian/[place]  female   G1P1 with No LMP recorded (lmp unknown). (Menstrual status: Irregular Periods).   here for Pelvic ultrasound   GYNECOLOGIC HISTORY: No LMP recorded (lmp unknown). (Menstrual status: Irregular Periods). Contraception:  none Menopausal hormone therapy:  *** Last mammogram: 11-11-20 Neg/BiRads1 Last pap smear: 11-10-20 Neg:Neg HR HPV, 01-20-18 neg HPV HR neg, 08-31-14 neg        OB History     Gravida  1   Para  1   Term      Preterm      AB      Living  1      SAB      IAB      Ectopic      Multiple      Live Births                 Patient Active Problem List   Diagnosis Date Noted   Cough 05/14/2011   School physical exam 12/05/2010   Paresthesias 10/08/2010   Dizziness 09/24/2010   DYSESTHESIA 12/29/2008   WEIGHT LOSS 12/29/2008   DIARRHEA 12/29/2008   VISUAL IMPAIRMENT, TRANSIENT 08/03/2008   HEMORRHOIDS 09/06/2007   IRRITABLE BOWEL SYNDROME, HX OF 09/06/2007   ALLERGIC RHINITIS 08/08/2007   ASTHMA 08/08/2007   ENDOMETRIOSIS 12/12/2006    Past Medical History:  Diagnosis Date   Allergy    Asthma    COVID-19 virus infection    Endometriosis    Hypokalemia    Left ovarian cyst    See Epic pelvic US 03/03/19.   Migraine    Pneumonia     Past Surgical History:  Procedure Laterality Date   BREAST SURGERY  1999   Left benign lymph node   CHOLECYSTECTOMY     FOOT SURGERY     PELVIC LAPAROSCOPY  1996   endometriosis    Current Outpatient Medications  Medication Sig Dispense Refill   acetaminophen (TYLENOL) 500 MG tablet Take 500 mg by mouth every 6 (six) hours as needed.     Calcium Carbonate-Vitamin D (CALCIUM + D PO) Take by mouth.     ibuprofen (ADVIL,MOTRIN) 800 MG tablet Take 1 tablet (800 mg total) by mouth every 8 (eight) hours as needed. 30 tablet 1   ondansetron (ZOFRAN-ODT) 8 MG disintegrating tablet DISSOLVE 1 TABLET(8 MG) ON THE TONGUE EVERY 8 HOURS AS  NEEDED FOR NAUSEA OR VOMITING 20 tablet 0   PROAIR HFA 108 (90 BASE) MCG/ACT inhaler as needed.     rizatriptan (MAXALT-MLT) 10 MG disintegrating tablet DISSOLVE 1 TABLET BY MOUTH AS NEEDED FOR MIGRAINE, MAY REPEAT IN 2 HOURS IF NEEDED 20 tablet 2   SUMAtriptan (IMITREX) 100 MG tablet TAKE 1 TABLET BY MOUTH EVERY 2 HOURS AS NEEDED FOR MIGRAINE. MAY REPEAT IN 2 HOURS IF HEADACHE PERSISTS OR RECURS 20 tablet 2   SYMBICORT 80-4.5 MCG/ACT inhaler Inhale 2 puffs into the lungs 2 (two) times daily.     No current facility-administered medications for this visit.     ALLERGIES: Latex, Sulfacetamide sodium, and Prednisone  Family History  Problem Relation Age of Onset   Hypertension Mother    Diabetes Mother    Heart attack Mother    Hypertension Father    Breast cancer Maternal Aunt 56   Cancer Maternal Grandmother        cervical   Breast cancer Maternal Grandmother        64's  Diabetes Maternal Grandfather    Breast cancer Paternal Aunt 54    Social History   Socioeconomic History   Marital status: Married    Spouse name: Not on file   Number of children: Not on file   Years of education: Not on file   Highest education level: Not on file  Occupational History   Not on file  Tobacco Use   Smoking status: Never   Smokeless tobacco: Never  Vaping Use   Vaping Use: Never used  Substance and Sexual Activity   Alcohol use: No    Alcohol/week: 0.0 standard drinks   Drug use: No   Sexual activity: Yes    Partners: Male    Birth control/protection: None  Other Topics Concern   Not on file  Social History Narrative   Not on file   Social Determinants of Health   Financial Resource Strain: Not on file  Food Insecurity: Not on file  Transportation Needs: Not on file  Physical Activity: Not on file  Stress: Not on file  Social Connections: Not on file  Intimate Partner Violence: Not on file    Review of Systems  PHYSICAL EXAMINATION:    LMP  (LMP Unknown) Comment:  maybe 2 yrs ago for last cycle    General appearance: alert, cooperative and appears stated age Head: Normocephalic, without obvious abnormality, atraumatic Neck: no adenopathy, supple, symmetrical, trachea midline and thyroid normal to inspection and palpation Lungs: clear to auscultation bilaterally Breasts: normal appearance, no masses or tenderness, No nipple retraction or dimpling, No nipple discharge or bleeding, No axillary or supraclavicular adenopathy Heart: regular rate and rhythm Abdomen: soft, non-tender, no masses,  no organomegaly Extremities: extremities normal, atraumatic, no cyanosis or edema Skin: Skin color, texture, turgor normal. No rashes or lesions Lymph nodes: Cervical, supraclavicular, and axillary nodes normal. No abnormal inguinal nodes palpated Neurologic: Grossly normal  Pelvic: External genitalia:  no lesions              Urethra:  normal appearing urethra with no masses, tenderness or lesions              Bartholins and Skenes: normal                 Vagina: normal appearing vagina with normal color and discharge, no lesions              Cervix: no lesions                Bimanual Exam:  Uterus:  normal size, contour, position, consistency, mobility, non-tender              Adnexa: no mass, fullness, tenderness              Rectal exam: {yes no:314532}.  Confirms.              Anus:  normal sphincter tone, no lesions  Chaperone was present for exam.  ASSESSMENT     PLAN     An After Visit Summary was printed and given to the patient.  ______ minutes face to face time of which over 50% was spent in counseling.

## 2020-11-23 ENCOUNTER — Other Ambulatory Visit: Payer: BC Managed Care – PPO | Admitting: Obstetrics and Gynecology

## 2020-11-23 ENCOUNTER — Other Ambulatory Visit: Payer: BC Managed Care – PPO

## 2021-01-10 NOTE — Progress Notes (Deleted)
GYNECOLOGY  VISIT   HPI: 55 y.o.   Married  Caucasian/[place]  female   G1P1 with No LMP recorded. (Menstrual status: Irregular Periods).   here for pelvic ultrasound.  GYNECOLOGIC HISTORY: No LMP recorded. (Menstrual status: Irregular Periods). Contraception: None Menopausal hormone therapy:  none Last mammogram:  11-11-20 3D/Neg/Birads1 Last pap smear: 11-10-20 Neg:Neg HR HPV, 01-20-18 Neg:Neg HR HPV, 08-31-14 Neg        OB History     Gravida  1   Para  1   Term      Preterm      AB      Living  1      SAB      IAB      Ectopic      Multiple      Live Births                 Patient Active Problem List   Diagnosis Date Noted   Cough 05/14/2011   School physical exam 12/05/2010   Paresthesias 10/08/2010   Dizziness 09/24/2010   DYSESTHESIA 12/29/2008   WEIGHT LOSS 12/29/2008   DIARRHEA 12/29/2008   VISUAL IMPAIRMENT, TRANSIENT 08/03/2008   HEMORRHOIDS 09/06/2007   IRRITABLE BOWEL SYNDROME, HX OF 09/06/2007   ALLERGIC RHINITIS 08/08/2007   ASTHMA 08/08/2007   ENDOMETRIOSIS 12/12/2006    Past Medical History:  Diagnosis Date   Allergy    Asthma    COVID-19 virus infection    Endometriosis    Hypokalemia    Left ovarian cyst    See Epic pelvic US 03/03/19.   Migraine    Pneumonia     Past Surgical History:  Procedure Laterality Date   BREAST SURGERY  1999   Left benign lymph node   CHOLECYSTECTOMY     FOOT SURGERY     PELVIC LAPAROSCOPY  1996   endometriosis    Current Outpatient Medications  Medication Sig Dispense Refill   acetaminophen (TYLENOL) 500 MG tablet Take 500 mg by mouth every 6 (six) hours as needed.     Calcium Carbonate-Vitamin D (CALCIUM + D PO) Take by mouth.     ibuprofen (ADVIL,MOTRIN) 800 MG tablet Take 1 tablet (800 mg total) by mouth every 8 (eight) hours as needed. 30 tablet 1   ondansetron (ZOFRAN-ODT) 8 MG disintegrating tablet DISSOLVE 1 TABLET(8 MG) ON THE TONGUE EVERY 8 HOURS AS NEEDED FOR NAUSEA OR  VOMITING 20 tablet 0   PROAIR HFA 108 (90 BASE) MCG/ACT inhaler as needed.     rizatriptan (MAXALT-MLT) 10 MG disintegrating tablet DISSOLVE 1 TABLET BY MOUTH AS NEEDED FOR MIGRAINE, MAY REPEAT IN 2 HOURS IF NEEDED 20 tablet 2   SUMAtriptan (IMITREX) 100 MG tablet TAKE 1 TABLET BY MOUTH EVERY 2 HOURS AS NEEDED FOR MIGRAINE. MAY REPEAT IN 2 HOURS IF HEADACHE PERSISTS OR RECURS 20 tablet 2   SYMBICORT 80-4.5 MCG/ACT inhaler Inhale 2 puffs into the lungs 2 (two) times daily.     No current facility-administered medications for this visit.     ALLERGIES: Latex, Sulfacetamide sodium, and Prednisone  Family History  Problem Relation Age of Onset   Hypertension Mother    Diabetes Mother    Heart attack Mother    Hypertension Father    Breast cancer Maternal Aunt 62   Cancer Maternal Grandmother        cervical   Breast cancer Maternal Grandmother        70's   Diabetes Maternal Grandfather  Breast cancer Paternal Aunt 73    Social History   Socioeconomic History   Marital status: Married    Spouse name: Not on file   Number of children: Not on file   Years of education: Not on file   Highest education level: Not on file  Occupational History   Not on file  Tobacco Use   Smoking status: Never   Smokeless tobacco: Never  Vaping Use   Vaping Use: Never used  Substance and Sexual Activity   Alcohol use: No    Alcohol/week: 0.0 standard drinks   Drug use: No   Sexual activity: Yes    Partners: Male    Birth control/protection: None  Other Topics Concern   Not on file  Social History Narrative   Not on file   Social Determinants of Health   Financial Resource Strain: Not on file  Food Insecurity: Not on file  Transportation Needs: Not on file  Physical Activity: Not on file  Stress: Not on file  Social Connections: Not on file  Intimate Partner Violence: Not on file    Review of Systems  PHYSICAL EXAMINATION:    There were no vitals taken for this visit.     General appearance: alert, cooperative and appears stated age Head: Normocephalic, without obvious abnormality, atraumatic Neck: no adenopathy, supple, symmetrical, trachea midline and thyroid normal to inspection and palpation Lungs: clear to auscultation bilaterally Breasts: normal appearance, no masses or tenderness, No nipple retraction or dimpling, No nipple discharge or bleeding, No axillary or supraclavicular adenopathy Heart: regular rate and rhythm Abdomen: soft, non-tender, no masses,  no organomegaly Extremities: extremities normal, atraumatic, no cyanosis or edema Skin: Skin color, texture, turgor normal. No rashes or lesions Lymph nodes: Cervical, supraclavicular, and axillary nodes normal. No abnormal inguinal nodes palpated Neurologic: Grossly normal  Pelvic: External genitalia:  no lesions              Urethra:  normal appearing urethra with no masses, tenderness or lesions              Bartholins and Skenes: normal                 Vagina: normal appearing vagina with normal color and discharge, no lesions              Cervix: no lesions                Bimanual Exam:  Uterus:  normal size, contour, position, consistency, mobility, non-tender              Adnexa: no mass, fullness, tenderness              Rectal exam: {yes no:314532}.  Confirms.              Anus:  normal sphincter tone, no lesions  Chaperone was present for exam:  ***  ASSESSMENT     PLAN     An After Visit Summary was printed and given to the patient.  ______ minutes face to face time of which over 50% was spent in counseling.

## 2021-01-12 ENCOUNTER — Ambulatory Visit (INDEPENDENT_AMBULATORY_CARE_PROVIDER_SITE_OTHER): Payer: BC Managed Care – PPO

## 2021-01-12 ENCOUNTER — Other Ambulatory Visit: Payer: Self-pay

## 2021-01-12 ENCOUNTER — Other Ambulatory Visit: Payer: BC Managed Care – PPO | Admitting: Obstetrics and Gynecology

## 2021-01-12 DIAGNOSIS — N83202 Unspecified ovarian cyst, left side: Secondary | ICD-10-CM | POA: Diagnosis not present

## 2021-01-15 ENCOUNTER — Telehealth: Payer: Self-pay | Admitting: Obstetrics and Gynecology

## 2021-01-15 NOTE — Telephone Encounter (Signed)
Please contact patient in follow up to her pelvic ultrasound done on 01/12/21.   She is followed for left ovarian cysts. Her ultrasound is now normal, and she had had resolution of the cysts.  No further routine ultrasounds are needed.  The details are as follows: Her uterus measured 6.64 x 3.76 x 3.04 cm.  Endometrial stripe 2.12 mm.  Her bilateral ovaries are normal with normal follicular pattern.  No adnexal masses are noted.  No free fluid was seen.

## 2021-01-17 NOTE — Telephone Encounter (Signed)
I called patient and left detailed message in voice mail with u/s result. I did tell her I will send My Chart message. I asked her to either login and read it and I will be able to see she received the message or call me back and let me know she received my voice mail.

## 2021-01-23 NOTE — Telephone Encounter (Signed)
Patient read her My Chart message. "Last read by Tressie Ellis at 10:15 AM on 01/17/2021."

## 2021-04-24 ENCOUNTER — Ambulatory Visit (INDEPENDENT_AMBULATORY_CARE_PROVIDER_SITE_OTHER): Payer: BC Managed Care – PPO | Admitting: Family Medicine

## 2021-04-24 VITALS — BP 120/70 | HR 99 | Temp 98.0°F | Wt 132.4 lb

## 2021-04-24 DIAGNOSIS — R5382 Chronic fatigue, unspecified: Secondary | ICD-10-CM

## 2021-04-24 DIAGNOSIS — Z789 Other specified health status: Secondary | ICD-10-CM

## 2021-04-24 DIAGNOSIS — R635 Abnormal weight gain: Secondary | ICD-10-CM | POA: Diagnosis not present

## 2021-04-24 DIAGNOSIS — R5383 Other fatigue: Secondary | ICD-10-CM

## 2021-04-24 DIAGNOSIS — Z780 Asymptomatic menopausal state: Secondary | ICD-10-CM

## 2021-04-24 MED ORDER — QSYMIA 7.5-46 MG PO CP24: 1.0000 | ORAL_CAPSULE | Freq: Every day | ORAL | 2 refills | Status: DC

## 2021-04-24 MED ORDER — QSYMIA 3.75-23 MG PO CP24: 1.0000 | ORAL_CAPSULE | Freq: Every day | ORAL | 0 refills | Status: DC

## 2021-04-24 MED ORDER — CYANOCOBALAMIN 1000 MCG/ML IJ SOLN
1000.0000 ug | Freq: Once | INTRAMUSCULAR | Status: AC
  Administered 2021-04-24: 1000 ug via INTRAMUSCULAR

## 2021-04-24 NOTE — Progress Notes (Signed)
Established Patient Office Visit  Subjective:  Patient ID: Jamie Cameron, female    DOB: 1966-04-23  Age: 55 y.o. MRN: [id number]  CC:  Chief Complaint  Patient presents with   Fatigue    Working long hours. Very tired, no energy     HPI [employer] presents for several issues as below.  Her major complaint is excessive fatigue.  She is actually working 3 different jobs right now and usually working 5 to 6 days/week 12-hour shifts per day.  She has had a lot of stress issues.  Her mother's had some health issues and she is trying to assist her.  She is still finding time to exercise several days per week.  No problems with exercise tolerance.  She has recently gone through menopause.  Frequent hot flashes.  She has seen gynecologist.  She is not interested in hormonal therapies.  She has had some weight gain which she thinks is related to the menopause.  She states she has gained about 20 pounds.  She is very frustrated with weight gain.  She feels like her diet has not changed.  She is a vegetarian.  She specifically is concerned about possible B12 deficiency.  She had several coworkers that discussed possible B12 injections for more energy.  She feels like her sleep quality is decent.  She also specifically is requesting consideration for appetite suppressant to assist with weight loss.  She is generally fairly healthy.  No history of diabetes.  She has history of some allergies and IBS symptoms as well as past history of endometriosis.  Non-smoker.  Past Medical History:  Diagnosis Date   Allergy    Asthma    COVID-19 virus infection    Endometriosis    Hypokalemia    Left ovarian cyst    See Epic pelvic US 03/03/19.   Migraine    Pneumonia     Past Surgical History:  Procedure Laterality Date   BREAST SURGERY  1999   Left benign lymph node   CHOLECYSTECTOMY     FOOT SURGERY     PELVIC LAPAROSCOPY  1996   endometriosis    Family History  Problem Relation Age of  Onset   Hypertension Mother    Diabetes Mother    Heart attack Mother    Hypertension Father    Breast cancer Maternal Aunt 1   Cancer Maternal Grandmother        cervical   Breast cancer Maternal Grandmother        70's   Diabetes Maternal Grandfather    Breast cancer Paternal Aunt 101    Social History   Socioeconomic History   Marital status: Married    Spouse name: Not on file   Number of children: Not on file   Years of education: Not on file   Highest education level: Not on file  Occupational History   Not on file  Tobacco Use   Smoking status: Never   Smokeless tobacco: Never  Vaping Use   Vaping Use: Never used  Substance and Sexual Activity   Alcohol use: No    Alcohol/week: 0.0 standard drinks   Drug use: No   Sexual activity: Yes    Partners: Male    Birth control/protection: None  Other Topics Concern   Not on file  Social History Narrative   Not on file   Social Determinants of Health   Financial Resource Strain: Not on file  Food Insecurity: Not on file  Transportation Needs: Not on file  Physical Activity: Not on file  Stress: Not on file  Social Connections: Not on file  Intimate Partner Violence: Not on file    Outpatient Medications Prior to Visit  Medication Sig Dispense Refill   acetaminophen (TYLENOL) 500 MG tablet Take 500 mg by mouth every 6 (six) hours as needed.     Calcium Carbonate-Vitamin D (CALCIUM + D PO) Take by mouth.     ibuprofen (ADVIL,MOTRIN) 800 MG tablet Take 1 tablet (800 mg total) by mouth every 8 (eight) hours as needed. 30 tablet 1   ondansetron (ZOFRAN-ODT) 8 MG disintegrating tablet DISSOLVE 1 TABLET(8 MG) ON THE TONGUE EVERY 8 HOURS AS NEEDED FOR NAUSEA OR VOMITING 20 tablet 0   PROAIR HFA 108 (90 BASE) MCG/ACT inhaler as needed.     rizatriptan (MAXALT-MLT) 10 MG disintegrating tablet DISSOLVE 1 TABLET BY MOUTH AS NEEDED FOR MIGRAINE, MAY REPEAT IN 2 HOURS IF NEEDED 20 tablet 2   SUMAtriptan (IMITREX) 100 MG  tablet TAKE 1 TABLET BY MOUTH EVERY 2 HOURS AS NEEDED FOR MIGRAINE. MAY REPEAT IN 2 HOURS IF HEADACHE PERSISTS OR RECURS 20 tablet 2   SYMBICORT 80-4.5 MCG/ACT inhaler Inhale 2 puffs into the lungs 2 (two) times daily.     No facility-administered medications prior to visit.    Allergies  Allergen Reactions   Latex Hives, Shortness Of Breath, Itching, Swelling and Rash   Sulfacetamide Sodium Nausea And Vomiting   Prednisone Nausea And Vomiting    ROS Review of Systems  Constitutional:  Positive for fatigue and unexpected weight change. Negative for appetite change and fever.  Respiratory:  Negative for cough and shortness of breath.   Cardiovascular:  Negative for chest pain and leg swelling.  Gastrointestinal:  Negative for abdominal pain.  Endocrine: Negative for polydipsia and polyuria.  Genitourinary:  Negative for dysuria.  Neurological:  Negative for headaches.     Objective:    Physical Exam Vitals reviewed.  Constitutional:      Appearance: Normal appearance.  Cardiovascular:     Rate and Rhythm: Normal rate and regular rhythm.  Pulmonary:     Effort: Pulmonary effort is normal.     Breath sounds: Normal breath sounds.  Musculoskeletal:     Cervical back: Neck supple.     Right lower leg: No edema.     Left lower leg: No edema.  Lymphadenopathy:     Cervical: No cervical adenopathy.  Neurological:     Mental Status: She is alert.    BP 120/70 (BP Location: Left Arm, Patient Position: Sitting, Cuff Size: Normal)   Pulse 99   Temp 98 F (36.7 C) (Oral)   Wt 132 lb 6.4 oz (60.1 kg)   SpO2 99%   BMI 25.02 kg/m  Wt Readings from Last 3 Encounters:  04/24/21 132 lb 6.4 oz (60.1 kg)  11/10/20 127 lb (57.6 kg)  01/23/19 128 lb (58.1 kg)     Health Maintenance Due  Topic Date Due   Pneumococcal Vaccine 61-7 Years old (1 - PCV) Never done   COLONOSCOPY (Pts 45-48yrs Insurance coverage will need to be confirmed)  Never done   Zoster Vaccines- Shingrix (1  of 2) Never done   COVID-19 Vaccine (4 - Booster for Pfizer series) 07/19/2020    There are no preventive care reminders to display for this patient.  Lab Results  Component Value Date   TSH 1.10 11/10/2020   Lab Results  Component Value Date  WBC 6.8 11/10/2020   HGB 13.7 11/10/2020   HCT 41.4 11/10/2020   MCV 92.6 11/10/2020   PLT 316 11/10/2020   Lab Results  Component Value Date   NA 142 11/10/2020   K 3.5 11/10/2020   CO2 30 11/10/2020   GLUCOSE 70 11/10/2020   BUN 17 11/10/2020   CREATININE 0.74 11/10/2020   BILITOT 0.3 11/10/2020   ALKPHOS 59 07/03/2017   AST 17 11/10/2020   ALT 14 11/10/2020   PROT 6.7 11/10/2020   ALBUMIN 4.3 07/03/2017   CALCIUM 9.6 11/10/2020   GFR 129.63 04/02/2017   Lab Results  Component Value Date   CHOL 216 (H) 11/10/2020   Lab Results  Component Value Date   HDL 70 11/10/2020   Lab Results  Component Value Date   LDLCALC 126 (H) 11/10/2020   Lab Results  Component Value Date   TRIG 102 11/10/2020   Lab Results  Component Value Date   CHOLHDL 3.1 11/10/2020   No results found for: HGBA1C    Assessment & Plan:   #1 fatigue.  We discussed the fact that some of this may be related to postmenopausal status but most obvious factor would be her very long work hours.  Recent TSH normal. We did discuss her risk for B12 deficiency with vegetarian dietary status. -Check B12 level. -We did agree to go ahead and give her 1 B12 injection but would like to confirm levels before doing this regularly..  Also discussed possible oral supplementation  #2 weight gain.  Does not have other significant comorbidities but she is very concerned about the recent weight gain.  Recent TSH normal.  No peripheral edema issues.  We did agree to trial of low-dose Qsymia 3.75 mg 1 daily if she can get this covered by insurance.  Would plan in 14 days if she tolerates to increase to 7.5 mg. -Need follow-up within 2 months to reassess  #3 post  menopause-patient sees GYN.  She is not interested in hormone replacement.  She is considering another GYN.  We briefly discussed nonhormonal treatments for hot flashes and at this point she declines   Meds ordered this encounter  Medications   Phentermine-Topiramate (QSYMIA) 3.75-23 MG CP24    Sig: Take 1 capsule by mouth daily.    Dispense:  14 capsule    Refill:  0   Phentermine-Topiramate (QSYMIA) 7.5-46 MG CP24    Sig: Take 1 capsule by mouth daily.    Dispense:  30 capsule    Refill:  2   cyanocobalamin ((VITAMIN B-12)) injection 1,000 mcg    Follow-up: Return in about 2 months (around 06/24/2021).    Evelena Peat, MD

## 2021-04-24 NOTE — Patient Instructions (Signed)
Set up 2 month follow up  We will call with the B12 results and give me some feedback after the injection (of B`12)

## 2021-04-25 ENCOUNTER — Telehealth: Payer: Self-pay

## 2021-04-25 NOTE — Telephone Encounter (Signed)
Received a fax from [facility] stating that Qsymia needs a PA. Attempted to start the PA on cover my meds but "patient not found" is the message given when using the insurance on file.   Called the patient to get the insurance information below and had to leave a message.   BIN: PCN: GRP: ID:

## 2021-04-25 NOTE — Telephone Encounter (Signed)
Patient returned call and provided insurance information  GRP: [id number] ID: [id number]  PA# [id number] MH# [phone]  BIN: [id number] GRP: [id number]  ID: [id number]  Customer service # [phone]

## 2021-04-25 NOTE — Telephone Encounter (Signed)
PA sent to cover my meds.  Key: [id number]

## 2021-04-25 NOTE — Telephone Encounter (Signed)
Patient called in to let Dr. Caryl Never that she feels much better after getting the B12.

## 2021-04-26 MED ORDER — CYANOCOBALAMIN 1000 MCG/ML IJ SOLN: INTRAMUSCULAR | 0 refills | Status: DC

## 2021-04-26 NOTE — Telephone Encounter (Signed)
ATC, unable to leave a voice mail to inform the patient her Rx was sent in.

## 2021-04-26 NOTE — Telephone Encounter (Signed)
RX sent in

## 2021-04-26 NOTE — Telephone Encounter (Signed)
Noted  

## 2021-04-26 NOTE — Telephone Encounter (Signed)
Spoke with the patient. She is aware that the PA has been started. She stated that she was able to use a discount card for this and if the PA is not approved she is fine with using the discount card. Will be in touch with the patient as I receive updates.

## 2021-04-26 NOTE — Telephone Encounter (Addendum)
Patient returned call and was informed of message and will pick up Rx from pharmacy

## 2021-05-01 ENCOUNTER — Telehealth: Payer: Self-pay | Admitting: Family Medicine

## 2021-05-01 NOTE — Telephone Encounter (Signed)
Form placed in Dr. Burchette's red folder.  °

## 2021-05-01 NOTE — Telephone Encounter (Signed)
Patient's son dropped off paperwork that she would like Dr. Caryl Never to complete.  Patient would like a call at [phone] once paperwork is completed.  Paperwork will be placed in folder.  Please advise.

## 2021-05-11 NOTE — Telephone Encounter (Signed)
PA has been denied.  Left message for patient to call back.

## 2021-05-12 NOTE — Telephone Encounter (Signed)
Spoke to the patient. She stated she will just use the discount card.

## 2021-06-15 DIAGNOSIS — L565 Disseminated superficial actinic porokeratosis (DSAP): Secondary | ICD-10-CM | POA: Diagnosis not present

## 2021-06-21 ENCOUNTER — Ambulatory Visit (INDEPENDENT_AMBULATORY_CARE_PROVIDER_SITE_OTHER): Payer: BC Managed Care – PPO | Admitting: Family Medicine

## 2021-06-21 ENCOUNTER — Encounter: Payer: Self-pay | Admitting: Family Medicine

## 2021-06-21 ENCOUNTER — Telehealth: Payer: Self-pay

## 2021-06-21 VITALS — BP 120/70 | HR 75 | Temp 98.1°F | Wt 123.0 lb

## 2021-06-21 DIAGNOSIS — R5383 Other fatigue: Secondary | ICD-10-CM

## 2021-06-21 DIAGNOSIS — Z1211 Encounter for screening for malignant neoplasm of colon: Secondary | ICD-10-CM | POA: Diagnosis not present

## 2021-06-21 LAB — COMPREHENSIVE METABOLIC PANEL
ALT: 13 U/L (ref 0–35)
AST: 16 U/L (ref 0–37)
Albumin: 4.4 g/dL (ref 3.5–5.2)
Alkaline Phosphatase: 59 U/L (ref 39–117)
BUN: 13 mg/dL (ref 6–23)
CO2: 30 mEq/L (ref 19–32)
Calcium: 9.3 mg/dL (ref 8.4–10.5)
Chloride: 103 mEq/L (ref 96–112)
Creatinine, Ser: 0.73 mg/dL (ref 0.40–1.20)
GFR: 93.18 mL/min (ref >60.00)
Glucose, Bld: 78 mg/dL (ref 70–99)
Potassium: 3.4 mEq/L — ABNORMAL LOW (ref 3.5–5.1)
Sodium: 141 mEq/L (ref 135–145)
Total Bilirubin: 0.5 mg/dL (ref 0.2–1.2)
Total Protein: 6.7 g/dL (ref 6.0–8.3)

## 2021-06-21 LAB — CBC WITH DIFFERENTIAL/PLATELET
Basophils Absolute: 0.1 10*3/uL (ref 0.0–0.1)
Basophils Relative: 1 % (ref 0.0–3.0)
Eosinophils Absolute: 0 10*3/uL (ref 0.0–0.7)
Eosinophils Relative: 0.7 % (ref 0.0–5.0)
HCT: 39.7 % (ref 36.0–46.0)
Hemoglobin: 13.4 g/dL (ref 12.0–15.0)
Lymphocytes Relative: 30.3 % (ref 12.0–46.0)
Lymphs Abs: 2.2 10*3/uL (ref 0.7–4.0)
MCHC: 33.7 g/dL (ref 30.0–36.0)
MCV: 91.5 fl (ref 78.0–100.0)
Monocytes Absolute: 0.6 10*3/uL (ref 0.1–1.0)
Monocytes Relative: 8.1 % (ref 3.0–12.0)
Neutro Abs: 4.4 10*3/uL (ref 1.4–7.7)
Neutrophils Relative %: 59.9 % (ref 43.0–77.0)
Platelets: 351 10*3/uL (ref 150.0–400.0)
RBC: 4.34 Mil/uL (ref 3.87–5.11)
RDW: 13.3 % (ref 11.5–15.5)
WBC: 7.3 10*3/uL (ref 4.0–10.5)

## 2021-06-21 LAB — VITAMIN B12: Vitamin B-12: 371 pg/mL (ref 211–911)

## 2021-06-21 LAB — TSH: TSH: 1.06 u[IU]/mL (ref 0.35–5.50)

## 2021-06-21 LAB — FERRITIN: Ferritin: 18.4 ng/mL (ref 10.0–291.0)

## 2021-06-21 MED ORDER — QSYMIA 11.25-69 MG PO CP24: 1.0000 | ORAL_CAPSULE | Freq: Every day | ORAL | 2 refills | Status: DC

## 2021-06-21 NOTE — Progress Notes (Signed)
Established Patient Office Visit  Subjective:  Patient ID: Jamie Cameron, female    DOB: 29-Jun-1965  Age: [age] y.o. MRN: [id number]  CC:  Chief Complaint  Patient presents with   Follow-up    Still very fatigued. Would like more blood work done    HPI [name] presents for follow-up regarding fatigue issues.  She specifically has concerns because of several family members having pernicious anemia.  She would like to get B12 level checked.  Previous CBC revealed no anemia or macrocytosis.  She does think that some of her fatigue may be stress related.  She has a son that recently went into the [employer] and will be in the [employer].  She has had some difficulty adjusting to that.  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  She had had some recent weight gain.  We started Qsymia which she is tolerated well.  She is pleased thus far has lost about 9 pounds.  She would like to consider 1 further titration level.  She states the weight she is felt best that has been 115 pounds.  She is 5 feet 3 inches tall.  Small frame.  Past Medical History:  Diagnosis Date   Allergy    Asthma    COVID-19 virus infection    Endometriosis    Hypokalemia    Left ovarian cyst    See Epic pelvic US 03/03/19.   Migraine    Pneumonia     Past Surgical History:  Procedure Laterality Date   BREAST SURGERY  1999   Left benign lymph node   CHOLECYSTECTOMY     FOOT SURGERY     PELVIC LAPAROSCOPY  1996   endometriosis    Family History  Problem Relation Age of Onset   Hypertension Mother    Diabetes Mother    Heart attack Mother    Hypertension Father    Breast cancer Maternal Aunt [id number]   Cancer Maternal Grandmother        cervical   Breast cancer Maternal Grandmother        70's   Diabetes Maternal Grandfather    Breast cancer Paternal Aunt [id number]    Social History   Socioeconomic History   Marital status: Married    Spouse name: Not on file    Number of children: Not on file   Years of education: Not on file   Highest education level: Not on file  Occupational History   Not on file  Tobacco Use   Smoking status: Never   Smokeless tobacco: Never  Vaping Use   Vaping Use: Never used  Substance and Sexual Activity   Alcohol use: No    Alcohol/week: 0.0 standard drinks   Drug use: No   Sexual activity: Yes    Partners: Male    Birth control/protection: None  Other Topics Concern   Not on file  Social History Narrative   Not on file   Social Determinants of Health   Financial Resource Strain: Not on file  Food Insecurity: Not on file  Transportation Needs: Not on file  Physical Activity: Not on file  Stress: Not on file  Social Connections: Not on file  Intimate Partner Violence: Not on file    Outpatient Medications Prior to Visit  Medication Sig Dispense Refill   acetaminophen (TYLENOL) 500 MG tablet Take 500 mg by mouth every 6 (six) hours as needed.  Calcium Carbonate-Vitamin D (CALCIUM + D PO) Take by mouth.     cyanocobalamin (,VITAMIN B-12,) 1000 MCG/ML injection Inject 93mL, IM, once a month, 10 mL 0   ibuprofen (ADVIL,MOTRIN) 800 MG tablet Take 1 tablet (800 mg total) by mouth every 8 (eight) hours as needed. 30 tablet 1   ondansetron (ZOFRAN-ODT) 8 MG disintegrating tablet DISSOLVE 1 TABLET(8 MG) ON THE TONGUE EVERY 8 HOURS AS NEEDED FOR NAUSEA OR VOMITING 20 tablet 0   PROAIR HFA 108 (90 BASE) MCG/ACT inhaler as needed.     rizatriptan (MAXALT-MLT) 10 MG disintegrating tablet DISSOLVE 1 TABLET BY MOUTH AS NEEDED FOR MIGRAINE, MAY REPEAT IN 2 HOURS IF NEEDED 20 tablet 2   SUMAtriptan (IMITREX) 100 MG tablet TAKE 1 TABLET BY MOUTH EVERY 2 HOURS AS NEEDED FOR MIGRAINE. MAY REPEAT IN 2 HOURS IF HEADACHE PERSISTS OR RECURS 20 tablet 2   SYMBICORT 80-4.5 MCG/ACT inhaler Inhale 2 puffs into the lungs 2 (two) times daily.     Phentermine-Topiramate (QSYMIA) 3.75-23 MG CP24 Take 1 capsule by mouth daily. 14  capsule 0   Phentermine-Topiramate (QSYMIA) 7.5-46 MG CP24 Take 1 capsule by mouth daily. 30 capsule 2   No facility-administered medications prior to visit.    Allergies  Allergen Reactions   Latex Hives, Shortness Of Breath, Itching, Swelling and Rash   Sulfacetamide Sodium Nausea And Vomiting   Prednisone Nausea And Vomiting    ROS Review of Systems  Constitutional:  Positive for fatigue. Negative for appetite change.  Respiratory:  Negative for cough and shortness of breath.   Cardiovascular:  Negative for chest pain.  Gastrointestinal:  Negative for abdominal pain.  Genitourinary:  Negative for dysuria.  Hematological:  Negative for adenopathy.     Objective:    Physical Exam Vitals reviewed.  Constitutional:      Appearance: Normal appearance.  Cardiovascular:     Rate and Rhythm: Normal rate and regular rhythm.  Pulmonary:     Effort: Pulmonary effort is normal.     Breath sounds: Normal breath sounds.  Musculoskeletal:     Cervical back: Neck supple.  Lymphadenopathy:     Cervical: No cervical adenopathy.  Neurological:     Mental Status: She is alert.    BP 120/70 (BP Location: Left Arm, Patient Position: Sitting, Cuff Size: Normal)    Pulse 75    Temp 98.1 F (36.7 C) (Oral)    Wt 123 lb (55.8 kg)    SpO2 99%    BMI 23.24 kg/m  Wt Readings from Last 3 Encounters:  06/21/21 123 lb (55.8 kg)  04/24/21 132 lb 6.4 oz (60.1 kg)  11/10/20 127 lb (57.6 kg)     Health Maintenance Due  Topic Date Due   Pneumococcal Vaccine 2-56 Years old (1 - PCV) Never done   COLONOSCOPY (Pts 45-12yrs Insurance coverage will need to be confirmed)  Never done   Zoster Vaccines- Shingrix (1 of 2) Never done   COVID-19 Vaccine (4 - Booster for Pfizer series) 07/19/2020    There are no preventive care reminders to display for this patient.  Lab Results  Component Value Date   TSH 1.10 11/10/2020   Lab Results  Component Value Date   WBC 6.8 11/10/2020   HGB 13.7  11/10/2020   HCT 41.4 11/10/2020   MCV 92.6 11/10/2020   PLT 316 11/10/2020   Lab Results  Component Value Date   NA 142 11/10/2020   K 3.5 11/10/2020   CO2 30 11/10/2020  GLUCOSE 70 11/10/2020   BUN 17 11/10/2020   CREATININE 0.74 11/10/2020   BILITOT 0.3 11/10/2020   ALKPHOS 59 07/03/2017   AST 17 11/10/2020   ALT 14 11/10/2020   PROT 6.7 11/10/2020   ALBUMIN 4.3 07/03/2017   CALCIUM 9.6 11/10/2020   GFR 129.63 04/02/2017   Lab Results  Component Value Date   CHOL 216 (H) 11/10/2020   Lab Results  Component Value Date   HDL 70 11/10/2020   Lab Results  Component Value Date   LDLCALC 126 (H) 11/10/2020   Lab Results  Component Value Date   TRIG 102 11/10/2020   Lab Results  Component Value Date   CHOLHDL 3.1 11/10/2020   No results found for: HGBA1C    Assessment & Plan:   Problem List Items Addressed This Visit   None Visit Diagnoses     Fatigue, unspecified type    -  Primary   Relevant Orders   CBC with Differential/Platelet   TSH   CMP   Vitamin B12   Ferritin     Suspect fatigue multifactorial.  Possibly some component of stress.  Doubt anemia but will check labs above.  We will specifically check B12 with her strong family history of pernicious anemia  She has not had colonoscopy since she was in her 30s.  We will go ahead and set this up  Meds ordered this encounter  Medications   Phentermine-Topiramate (QSYMIA) 11.25-69 MG CP24    Sig: Take 1 capsule by mouth daily.    Dispense:  30 capsule    Refill:  2    Follow-up: No follow-ups on file.    Evelena Peat, MD

## 2021-06-21 NOTE — Telephone Encounter (Signed)
Spoke with the patient. She is aware the letter has been completed. She stated that it does have to have Dr. Lucie Leather signature so she will pick this up Friday as planned. Letter has been placed up front.

## 2021-06-21 NOTE — Telephone Encounter (Signed)
Patient called in. She had an appointment this morning and her work is requiring a note stating that she does not have any communicable diseases and that she has no restrictions in order for her to continue working for them.   Please advise. [place] for letter? Patient states she will pick this up Friday morning.

## 2021-06-22 ENCOUNTER — Telehealth: Payer: Self-pay | Admitting: Family Medicine

## 2021-06-22 ENCOUNTER — Encounter: Payer: Self-pay | Admitting: Family Medicine

## 2021-06-22 NOTE — Telephone Encounter (Signed)
Patient called regarding her labs results. She says that her results for B12 are low and she is wanting to schedule an appointment for an injection for tomorrow morning.   Informed patient that Dr. Caryl Never is out of the office today and has not reviewed lab results and that once he reviews them, his medical assistant will reach out. 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 Informed patient that if an injection is necessary, after Dr.Burchette reviews the lab results, they will go ahead and schedule it. Told patient that we would have to wait for orders to be put in before scheduling.  Patient asked if Dr. 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 Informed patient that he reviews messages and results in between patients and there is no guarantee that it would be reviewed first thing tomorrow morning. Patient asked for a message to be sent back to ask if Dr. Caryl Never could review her lab results first thing. Told patient that a message could be sent but again, there are no guarantees that he would review them first thing, as he responds to messages and results in between patients.  Patient states that she would like a message sent back anyway, and that she will also send a message back, as she is a travel nurse, so she knows that she is needing an injection. She also reiterated that she is in town tomorrow only, so she is wanting to be scheduled for tomorrow.  Please advise.

## 2021-06-23 ENCOUNTER — Other Ambulatory Visit: Payer: Self-pay

## 2021-06-23 MED ORDER — CYANOCOBALAMIN 1000 MCG/ML IJ SOLN: INTRAMUSCULAR | 0 refills | Status: AC

## 2021-06-23 NOTE — Telephone Encounter (Signed)
Spoke with the patient. She stated she also wants a B12 injection because she is a vegetarian.

## 2021-06-23 NOTE — Telephone Encounter (Signed)
Spoke with the patient. She asked that injection be sent to the pharmacy. Rx has been sent in. Patient also asked for a potassium supplement since she is vegetarian. Per Dr. Caryl Never there are plenty of potassium rich foods that are included in a vegetarian diet and she should try to increase potassium through diet before trying a potassium supplement.

## 2021-06-26 DIAGNOSIS — L565 Disseminated superficial actinic porokeratosis (DSAP): Secondary | ICD-10-CM | POA: Diagnosis not present

## 2021-06-26 DIAGNOSIS — L570 Actinic keratosis: Secondary | ICD-10-CM | POA: Diagnosis not present

## 2021-08-17 ENCOUNTER — Encounter: Payer: Self-pay | Admitting: Family Medicine

## 2021-08-23 ENCOUNTER — Encounter: Payer: Self-pay | Admitting: Gastroenterology

## 2021-09-17 ENCOUNTER — Encounter: Payer: Self-pay | Admitting: Family Medicine

## 2021-09-18 MED ORDER — QSYMIA 15-92 MG PO CP24: 1.0000 | ORAL_CAPSULE | Freq: Every day | ORAL | 1 refills | Status: DC

## 2021-09-18 NOTE — Telephone Encounter (Signed)
I sent in next dose of Qsymia which is 15/92 mg and take one daily.  Set up office follow up in one month to reassess.    ?

## 2021-09-19 DIAGNOSIS — Z01419 Encounter for gynecological examination (general) (routine) without abnormal findings: Secondary | ICD-10-CM | POA: Diagnosis not present

## 2021-09-19 DIAGNOSIS — Z0142 Encounter for cervical smear to confirm findings of recent normal smear following initial abnormal smear: Secondary | ICD-10-CM | POA: Diagnosis not present

## 2021-09-19 DIAGNOSIS — Z01411 Encounter for gynecological examination (general) (routine) with abnormal findings: Secondary | ICD-10-CM | POA: Diagnosis not present

## 2021-09-19 DIAGNOSIS — Z124 Encounter for screening for malignant neoplasm of cervix: Secondary | ICD-10-CM | POA: Diagnosis not present

## 2021-09-19 DIAGNOSIS — Z6820 Body mass index (BMI) 20.0-20.9, adult: Secondary | ICD-10-CM | POA: Diagnosis not present

## 2021-09-20 ENCOUNTER — Telehealth: Payer: Self-pay

## 2021-09-20 NOTE — Telephone Encounter (Signed)
I intiated PA for pt's Phentermine Topiramate  (Qsymia) 15/92 mg and the PA has been approved by pt's insurance. Left a message informing pt of this approval. [id number] ?

## 2021-10-04 ENCOUNTER — Ambulatory Visit (AMBULATORY_SURGERY_CENTER): Payer: BC Managed Care – PPO | Admitting: *Deleted

## 2021-10-04 VITALS — Ht 63.0 in | Wt 107.0 lb

## 2021-10-04 DIAGNOSIS — Z1211 Encounter for screening for malignant neoplasm of colon: Secondary | ICD-10-CM

## 2021-10-04 MED ORDER — NA SULFATE-K SULFATE-MG SULF 17.5-3.13-1.6 GM/177ML PO SOLN: 1.0000 | Freq: Once | ORAL | 0 refills | Status: AC

## 2021-10-04 NOTE — Progress Notes (Signed)

## 2021-10-16 DIAGNOSIS — Z1231 Encounter for screening mammogram for malignant neoplasm of breast: Secondary | ICD-10-CM | POA: Diagnosis not present

## 2021-10-16 DIAGNOSIS — N9412 Deep dyspareunia: Secondary | ICD-10-CM | POA: Diagnosis not present

## 2021-10-16 DIAGNOSIS — Z131 Encounter for screening for diabetes mellitus: Secondary | ICD-10-CM | POA: Diagnosis not present

## 2021-10-16 DIAGNOSIS — Z13220 Encounter for screening for lipoid disorders: Secondary | ICD-10-CM | POA: Diagnosis not present

## 2021-10-16 DIAGNOSIS — N950 Postmenopausal bleeding: Secondary | ICD-10-CM | POA: Diagnosis not present

## 2021-10-16 DIAGNOSIS — Z0000 Encounter for general adult medical examination without abnormal findings: Secondary | ICD-10-CM | POA: Diagnosis not present

## 2021-10-16 DIAGNOSIS — Z1329 Encounter for screening for other suspected endocrine disorder: Secondary | ICD-10-CM | POA: Diagnosis not present

## 2021-10-25 ENCOUNTER — Encounter: Payer: BC Managed Care – PPO | Admitting: Gastroenterology

## 2021-10-26 ENCOUNTER — Telehealth: Payer: Self-pay | Admitting: Family Medicine

## 2021-10-26 NOTE — Telephone Encounter (Signed)
Patient called because she is needing more refills on her Phentermine-Topiramate (QSYMIA) 15-92 MG CP24. Patient will be getting her final refill so she is not out of the medication but she has no more refills left at pharmacy. Patient does want it noted that she is still taking the prescription as advised, as she does very well on it and she was only instructed to discontinue two weeks before her endoscopy procedure which was rescheduled to July. She wants to stay on this medication.     Please send to   [id number] [place], [place] - [address] AT [facility] [address] & [facility] [address] Phone:  [phone]  Fax:  [phone]        Please advise

## 2021-10-26 NOTE — Telephone Encounter (Signed)
Last refill- 09/18/21-30 tabs, 1 refill Pharmacy updated.

## 2021-10-30 MED ORDER — QSYMIA 15-92 MG PO CP24: 1.0000 | ORAL_CAPSULE | Freq: Every day | ORAL | 1 refills | Status: DC

## 2021-10-30 NOTE — Telephone Encounter (Signed)
Refilled but will need office follow up within 2 months prior to further refills.

## 2021-11-03 DIAGNOSIS — M9907 Segmental and somatic dysfunction of upper extremity: Secondary | ICD-10-CM | POA: Diagnosis not present

## 2021-11-03 DIAGNOSIS — S43402A Unspecified sprain of left shoulder joint, initial encounter: Secondary | ICD-10-CM | POA: Diagnosis not present

## 2021-11-03 DIAGNOSIS — M7552 Bursitis of left shoulder: Secondary | ICD-10-CM | POA: Diagnosis not present

## 2021-11-03 DIAGNOSIS — S134XXA Sprain of ligaments of cervical spine, initial encounter: Secondary | ICD-10-CM | POA: Diagnosis not present

## 2021-11-03 DIAGNOSIS — M530 Cervicocranial syndrome: Secondary | ICD-10-CM | POA: Diagnosis not present

## 2021-11-03 DIAGNOSIS — M9901 Segmental and somatic dysfunction of cervical region: Secondary | ICD-10-CM | POA: Diagnosis not present

## 2021-11-03 DIAGNOSIS — M531 Cervicobrachial syndrome: Secondary | ICD-10-CM | POA: Diagnosis not present

## 2021-11-03 DIAGNOSIS — M7918 Myalgia, other site: Secondary | ICD-10-CM | POA: Diagnosis not present

## 2021-11-03 DIAGNOSIS — M7551 Bursitis of right shoulder: Secondary | ICD-10-CM | POA: Diagnosis not present

## 2021-11-06 NOTE — Telephone Encounter (Signed)
Called patient lvm to call office and schedule 2 month f/u.

## 2021-11-07 DIAGNOSIS — M531 Cervicobrachial syndrome: Secondary | ICD-10-CM | POA: Diagnosis not present

## 2021-11-07 DIAGNOSIS — S134XXA Sprain of ligaments of cervical spine, initial encounter: Secondary | ICD-10-CM | POA: Diagnosis not present

## 2021-11-07 DIAGNOSIS — S43402A Unspecified sprain of left shoulder joint, initial encounter: Secondary | ICD-10-CM | POA: Diagnosis not present

## 2021-11-07 DIAGNOSIS — M9907 Segmental and somatic dysfunction of upper extremity: Secondary | ICD-10-CM | POA: Diagnosis not present

## 2021-11-07 DIAGNOSIS — M7552 Bursitis of left shoulder: Secondary | ICD-10-CM | POA: Diagnosis not present

## 2021-11-07 DIAGNOSIS — M7551 Bursitis of right shoulder: Secondary | ICD-10-CM | POA: Diagnosis not present

## 2021-11-07 DIAGNOSIS — M7918 Myalgia, other site: Secondary | ICD-10-CM | POA: Diagnosis not present

## 2021-11-07 DIAGNOSIS — M9901 Segmental and somatic dysfunction of cervical region: Secondary | ICD-10-CM | POA: Diagnosis not present

## 2021-11-07 DIAGNOSIS — M530 Cervicocranial syndrome: Secondary | ICD-10-CM | POA: Diagnosis not present

## 2021-11-13 DIAGNOSIS — M7552 Bursitis of left shoulder: Secondary | ICD-10-CM | POA: Diagnosis not present

## 2021-11-13 DIAGNOSIS — M530 Cervicocranial syndrome: Secondary | ICD-10-CM | POA: Diagnosis not present

## 2021-11-13 DIAGNOSIS — M9907 Segmental and somatic dysfunction of upper extremity: Secondary | ICD-10-CM | POA: Diagnosis not present

## 2021-11-13 DIAGNOSIS — M7551 Bursitis of right shoulder: Secondary | ICD-10-CM | POA: Diagnosis not present

## 2021-11-13 DIAGNOSIS — S43402A Unspecified sprain of left shoulder joint, initial encounter: Secondary | ICD-10-CM | POA: Diagnosis not present

## 2021-11-13 DIAGNOSIS — M531 Cervicobrachial syndrome: Secondary | ICD-10-CM | POA: Diagnosis not present

## 2021-11-13 DIAGNOSIS — S134XXA Sprain of ligaments of cervical spine, initial encounter: Secondary | ICD-10-CM | POA: Diagnosis not present

## 2021-11-13 DIAGNOSIS — M9901 Segmental and somatic dysfunction of cervical region: Secondary | ICD-10-CM | POA: Diagnosis not present

## 2021-11-13 DIAGNOSIS — M7918 Myalgia, other site: Secondary | ICD-10-CM | POA: Diagnosis not present

## 2021-12-13 ENCOUNTER — Encounter: Payer: Self-pay | Admitting: Gastroenterology

## 2021-12-13 ENCOUNTER — Ambulatory Visit (AMBULATORY_SURGERY_CENTER): Payer: BC Managed Care – PPO | Admitting: Gastroenterology

## 2021-12-13 VITALS — BP 122/81 | HR 60 | Temp 96.9°F | Resp 11 | Ht 61.0 in | Wt 107.0 lb

## 2021-12-13 DIAGNOSIS — Z1211 Encounter for screening for malignant neoplasm of colon: Secondary | ICD-10-CM | POA: Diagnosis not present

## 2021-12-13 MED ORDER — SODIUM CHLORIDE 0.9 % IV SOLN: 500.0000 mL | Freq: Once | INTRAVENOUS | Status: DC

## 2021-12-13 NOTE — Patient Instructions (Signed)
YOU HAD AN ENDOSCOPIC PROCEDURE TODAY AT THE [facility]:   Refer to the procedure report that was given to you for any specific questions about what was found during the examination.  If the procedure report does not answer your questions, please call your gastroenterologist to clarify.  If you requested that your care partner not be given the details of your procedure findings, then the procedure report has been included in a sealed envelope for you to review at your convenience later.  YOU SHOULD EXPECT: Some feelings of bloating in the abdomen. Passage of more gas than usual.  Walking can help get rid of the air that was put into your GI tract during the procedure and reduce the bloating. If you had a lower endoscopy (such as a colonoscopy or flexible sigmoidoscopy) you may notice spotting of blood in your stool or on the toilet paper. If you underwent a bowel prep for your procedure, you may not have a normal bowel movement for a few days.  Please Note:  You might notice some irritation and congestion in your nose or some drainage.  This is from the oxygen used during your procedure.  There is no need for concern and it should clear up in a day or so.  SYMPTOMS TO REPORT IMMEDIATELY:  Following lower endoscopy (colonoscopy or flexible sigmoidoscopy):  Excessive amounts of blood in the stool  Significant tenderness or worsening of abdominal pains  Swelling of the abdomen that is new, acute  Fever of 100F or higher  Following upper endoscopy (EGD)  Vomiting of blood or coffee ground material  New chest pain or pain under the shoulder blades  Painful or persistently difficult swallowing  New shortness of breath  Fever of 100F or higher  Black, tarry-looking stools  For urgent or emergent issues, a gastroenterologist can be reached at any hour by calling [phone]. Do not use MyChart messaging for urgent concerns.    DIET:  We do recommend a small meal at first, but  then you may proceed to your regular diet.  Drink plenty of fluids but you should avoid alcoholic beverages for 24 hours.  ACTIVITY:  You should plan to take it easy for the rest of today and you should NOT DRIVE or use heavy machinery until tomorrow (because of the sedation medicines used during the test).    FOLLOW UP: Our staff will call the number listed on your records the next business day following your procedure.  We will call around 7:15- 8:00 am to check on you and address any questions or concerns that you may have regarding the information given to you following your procedure. If we do not reach you, we will leave a message.  If you develop any symptoms (ie: fever, flu-like symptoms, shortness of breath, cough etc.) before then, please call [phone].  If you test positive for Covid 19 in the 2 weeks post procedure, please call and report this information to us.    If any biopsies were taken you will be contacted by phone or by letter within the next 1-3 weeks.  Please call us at [phone] if you have not heard about the biopsies in 3 weeks.    SIGNATURES/CONFIDENTIALITY: You and/or your care partner have signed paperwork which will be entered into your electronic medical record.  These signatures attest to the fact that that the information above on your After Visit Summary has been reviewed and is understood.  Full responsibility of the confidentiality   of this discharge information lies with you and/or your care-partner.  

## 2021-12-13 NOTE — Progress Notes (Signed)
To pacu, VSS. Report to [email or web address] 

## 2021-12-13 NOTE — Op Note (Signed)
Jamie Cameron: Jamie Cameron Procedure Date: 12/13/2021 9:08 AM MRN: [id number] Endoscopist: Jackquline Denmark , MD Age: 56 Referring MD:  Date of Birth: Mar 31, 1966 Gender: Female Account #: [id number] Procedure:                Colonoscopy Indications:              Screening for colorectal malignant neoplasm Medicines:                Monitored Anesthesia Care Procedure:                Pre-Anesthesia Assessment:                           - Prior to the procedure, a History and Physical                            was performed, and patient medications and                            allergies were reviewed. The patient's tolerance of                            previous anesthesia was also reviewed. The risks                            and benefits of the procedure and the sedation                            options and risks were discussed with the patient.                            All questions were answered, and informed consent                            was obtained. Prior Anticoagulants: The patient has                            taken no previous anticoagulant or antiplatelet                            agents. ASA Grade Assessment: II - A patient with                            mild systemic disease. After reviewing the risks                            and benefits, the patient was deemed in                            satisfactory condition to undergo the procedure.                           After obtaining informed consent, the colonoscope  was passed under direct vision. Throughout the                            procedure, the patient's blood pressure, pulse, and                            oxygen saturations were monitored continuously. The                            Olympus PCF-H190DL (#[id number]) Colonoscope was                            introduced through the anus and advanced to the 2                            cm into the ileum. The  colonoscopy was performed                            without difficulty. The patient tolerated the                            procedure well. The quality of the bowel                            preparation was good. The terminal ileum, ileocecal                            valve, appendiceal orifice, and rectum were                            photographed. Scope In: 9:14:34 AM Scope Out: 9:33:31 AM Scope Withdrawal Time: 0 hours 13 minutes 16 seconds  Total Procedure Duration: 0 hours 18 minutes 57 seconds  Findings:                 The colon (entire examined portion) appeared normal.                           Non-bleeding internal hemorrhoids were found during                            retroflexion. The hemorrhoids were small and Grade                            I (internal hemorrhoids that do not prolapse).                           The terminal ileum appeared normal.                           The exam was otherwise without abnormality on                            direct and retroflexion views. The colon was  somewhat redundant. Complications:            No immediate complications. Estimated Blood Loss:     Estimated blood loss: none. Impression:               -Minimal internal hemorrhoids.                           -Otherwise normal colonoscopy to TI.                           -No specimens collected. Recommendation:           - Patient has a contact number available for                            emergencies. The signs and symptoms of potential                            delayed complications were discussed with the                            patient. Return to normal activities tomorrow.                            Written discharge instructions were provided to the                            patient.                           - Resume previous diet.                           - Continue present medications.                           - Repeat colonoscopy  in 10 years for screening                            purposes. Earlier, if with any new problems or                            change in family history.                           - The findings and recommendations were discussed                            with the patient's sis. Lynann Bologna, MD 12/13/2021 9:37:23 AM This report has been signed electronically.

## 2021-12-13 NOTE — Progress Notes (Signed)
Pt's states no medical or surgical changes since previsit or office visit. 

## 2021-12-13 NOTE — Progress Notes (Signed)
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  No significant diarrhea or constipation.  No melena or hematochezia. No unintentional weight loss. No abdominal pain.   Last colon 2009 neg Past Medical History:  Diagnosis Date   Allergy    Anemia    Asthma    COVID-19 virus infection    Endometriosis    Hypokalemia    Left ovarian cyst    See Epic pelvic US 03/03/19.   Migraine    Pneumonia     Past Surgical History:  Procedure Laterality Date   BREAST SURGERY  1999   Left benign lymph node   CHOLECYSTECTOMY     FOOT SURGERY     PELVIC LAPAROSCOPY  1996   endometriosis    Prior to Admission medications   Medication Sig Start Date End Date Taking? Authorizing Provider  acetaminophen (TYLENOL) 500 MG tablet Take 500 mg by mouth every 6 (six) hours as needed.   Yes [provider]  Calcium Carbonate-Vitamin D (CALCIUM + D PO) Take by mouth.    [provider]  cyanocobalamin (,VITAMIN B-12,) 1000 MCG/ML injection Inject 1cc intermuscular once weekly for 4 (four) weeks, then once a month, 06/23/21   Burchette, Elberta Fortis, MD  ibuprofen (ADVIL,MOTRIN) 800 MG tablet Take 1 tablet (800 mg total) by mouth every 8 (eight) hours as needed. Patient not taking: Reported on 10/04/2021 08/31/14   Fontaine, Nadyne Coombes, MD  ondansetron (ZOFRAN-ODT) 8 MG disintegrating tablet DISSOLVE 1 TABLET(8 MG) ON THE TONGUE EVERY 8 HOURS AS NEEDED FOR NAUSEA OR VOMITING 11/10/20   Ardell Isaacs, Forrestine Him, MD  Phentermine-Topiramate (QSYMIA) 15-92 MG CP24 Take 1 capsule by mouth daily. 10/30/21   Burchette, Elberta Fortis, MD  PROAIR HFA 108 (90 BASE) MCG/ACT inhaler as needed. 03/03/13   [provider]  rizatriptan (MAXALT-MLT) 10 MG  disintegrating tablet DISSOLVE 1 TABLET BY MOUTH AS NEEDED FOR MIGRAINE, MAY REPEAT IN 2 HOURS IF NEEDED 11/10/20   Ardell Isaacs, Brook E, MD  SUMAtriptan (IMITREX) 100 MG tablet TAKE 1 TABLET BY MOUTH EVERY 2 HOURS AS NEEDED FOR MIGRAINE. MAY REPEAT IN 2 HOURS IF HEADACHE PERSISTS OR RECURS 11/10/20   Ardell Isaacs, Forrestine Him, MD  SYMBICORT 80-4.5 MCG/ACT inhaler Inhale 2 puffs into the lungs 2 (two) times daily. 03/02/13   [provider]    Current Outpatient Medications  Medication Sig Dispense Refill   acetaminophen (TYLENOL) 500 MG tablet Take 500 mg by mouth every 6 (six) hours as needed.     Calcium Carbonate-Vitamin D (CALCIUM + D PO) Take by mouth.     cyanocobalamin (,VITAMIN B-12,) 1000 MCG/ML injection Inject 1cc intermuscular once weekly for 4 (four) weeks, then once a month, 10 mL 0   ibuprofen (ADVIL,MOTRIN) 800 MG tablet Take 1 tablet (800 mg total) by mouth every 8 (eight) hours as needed. (Patient not taking: Reported on 10/04/2021) 30 tablet 1   ondansetron (ZOFRAN-ODT) 8 MG disintegrating tablet DISSOLVE 1 TABLET(8 MG) ON THE TONGUE EVERY 8 HOURS AS NEEDED FOR NAUSEA OR VOMITING 20 tablet 0   Phentermine-Topiramate (QSYMIA) 15-92 MG CP24 Take 1 capsule by mouth daily. 30 capsule 1   PROAIR HFA 108 (90 BASE) MCG/ACT  inhaler as needed.     rizatriptan (MAXALT-MLT) 10 MG disintegrating tablet DISSOLVE 1 TABLET BY MOUTH AS NEEDED FOR MIGRAINE, MAY REPEAT IN 2 HOURS IF NEEDED 20 tablet 2   SUMAtriptan (IMITREX) 100 MG tablet TAKE 1 TABLET BY MOUTH EVERY 2 HOURS AS NEEDED FOR MIGRAINE. MAY REPEAT IN 2 HOURS IF HEADACHE PERSISTS OR RECURS 20 tablet 2   SYMBICORT 80-4.5 MCG/ACT inhaler Inhale 2 puffs into the lungs 2 (two) times daily.     Current Facility-Administered Medications  Medication Dose Route Frequency Provider Last Rate Last Admin   0.9 %  sodium chloride infusion  500 mL Intravenous Once Lynann Bologna, MD        Allergies as of 12/13/2021 - Review Complete  12/13/2021  Allergen Reaction Noted   Latex Hives, Shortness Of Breath, Itching, Swelling, and Rash    Sulfacetamide sodium Nausea And Vomiting    Prednisone Nausea And Vomiting 10/20/2009    Family History  Problem Relation Age of Onset   Hypertension Mother    Diabetes Mother    Heart attack Mother    Hypertension Father    Crohn's disease Sister    Breast cancer Maternal Aunt 69   Breast cancer Paternal Aunt 60   Cancer Maternal Grandmother        cervical   Breast cancer Maternal Grandmother        70's   Diabetes Maternal Grandfather    Colon cancer Neg Hx    Cystic fibrosis Neg Hx    Esophageal cancer Neg Hx    Rectal cancer Neg Hx    Stomach cancer Neg Hx     Social History   Socioeconomic History   Marital status: Married    Spouse name: Not on file   Number of children: Not on file   Years of education: Not on file   Highest education level: Not on file  Occupational History   Not on file  Tobacco Use   Smoking status: Never   Smokeless tobacco: Never  Vaping Use   Vaping Use: Never used  Substance and Sexual Activity   Alcohol use: No    Alcohol/week: 0.0 standard drinks of alcohol   Drug use: No   Sexual activity: Yes    Partners: Male    Birth control/protection: None  Other Topics Concern   Not on file  Social History Narrative   Not on file   Social Determinants of Health   Financial Resource Strain: Not on file  Food Insecurity: Not on file  Transportation Needs: Not on file  Physical Activity: Not on file  Stress: Not on file  Social Connections: Not on file  Intimate Partner Violence: Not on file    Review of Systems: Positive for none All other review of systems negative except as mentioned in the HPI.  Physical Exam: Vital signs in last 24 hours: @VSRANGES @   General:   Alert,  Well-developed, well-nourished, pleasant and cooperative in NAD Lungs:  Clear throughout to auscultation.   Heart:  Regular rate and rhythm; no  murmurs, clicks, rubs,  or gallops. Abdomen:  Soft, nontender and nondistended. Normal bowel sounds.   Neuro/Psych:  Alert and cooperative. Normal mood and affect. A and O x 3    No significant changes were identified.  The patient continues to be an appropriate candidate for the planned procedure and anesthesia.   , MD. [facility] 12/13/2021 9:06 AM@

## 2021-12-14 ENCOUNTER — Telehealth: Payer: Self-pay | Admitting: *Deleted

## 2021-12-14 NOTE — Telephone Encounter (Signed)
First follow up call attempt.  LVM to call with any questions or concerns. 

## 2022-01-15 ENCOUNTER — Encounter: Payer: Self-pay | Admitting: Family Medicine

## 2022-01-15 ENCOUNTER — Ambulatory Visit: Payer: BC Managed Care – PPO | Admitting: Family Medicine

## 2022-01-15 VITALS — BP 106/60 | HR 95 | Temp 98.4°F | Ht 61.0 in | Wt 112.3 lb

## 2022-01-15 DIAGNOSIS — Z8669 Personal history of other diseases of the nervous system and sense organs: Secondary | ICD-10-CM | POA: Diagnosis not present

## 2022-01-15 DIAGNOSIS — L565 Disseminated superficial actinic porokeratosis (DSAP): Secondary | ICD-10-CM

## 2022-01-15 MED ORDER — QSYMIA 15-92 MG PO CP24
1.0000 | ORAL_CAPSULE | Freq: Every day | ORAL | 5 refills | Status: DC
Start: 2022-01-15 — End: 2022-08-25

## 2022-01-15 NOTE — Progress Notes (Signed)
Established Patient Office Visit  Subjective   Patient ID: Jamie Cameron, female    DOB: 1965-12-26  Age: 56 y.o. MRN: [id number]  Chief Complaint  Patient presents with   Follow-up    HPI   Jamie Cameron is here for a medication recheck.  She was started on Qsymia months ago for weight concerns.  She is currently on dosage of 15 mg and tolerating well.  She has lost about 13 pounds overall.  She feels well.  She exercises regularly.  She states her goal weight is around 105 pounds.  She states this is where she has felt her best in the past.  Still has decent appetite.  She has longstanding history of migraines and not surprisingly has seen tremendous reduction in her migraines since taking the Qsymia  She has bilateral lower leg rash which has been diagnosed by dermatology as disseminated superficial actinic porokeratosis (confirmed by previous biopsy).  She has tried various topicals including steroids without improvement.  She had done some research and was particularly interested in possible topical statin  Past Medical History:  Diagnosis Date   Allergy    Anemia    Asthma    COVID-19 virus infection    Endometriosis    Hypokalemia    Left ovarian cyst    See Epic pelvic US 03/03/19.   Migraine    Pneumonia    Past Surgical History:  Procedure Laterality Date   BREAST SURGERY  1999   Left benign lymph node   CHOLECYSTECTOMY     FOOT SURGERY     PELVIC LAPAROSCOPY  1996   endometriosis    reports that she has never smoked. She has never used smokeless tobacco. She reports that she does not drink alcohol and does not use drugs. family history includes Breast cancer in her maternal grandmother; Breast cancer (age of onset: 97) in her paternal aunt; Breast cancer (age of onset: 65) in her maternal aunt; Cancer in her maternal grandmother; Crohn's disease in her sister; Diabetes in her maternal grandfather and mother; Heart attack in her mother; Hypertension in her father and  mother. Allergies  Allergen Reactions   Latex Hives, Shortness Of Breath, Itching, Swelling and Rash   Sulfacetamide Sodium Nausea And Vomiting   Prednisone Nausea And Vomiting    Review of Systems  Constitutional:  Negative for chills and fever.  Cardiovascular:  Negative for chest pain.  Skin:  Positive for rash.  Neurological:  Negative for headaches.      Objective:     BP 106/60 (BP Location: Left Arm, Patient Position: Sitting, Cuff Size: Normal)   Pulse 95   Temp 98.4 F (36.9 C) (Oral)   Ht 5\' 1"  (1.549 m)   Wt 112 lb 4.8 oz (50.9 kg)   SpO2 99%   BMI 21.22 kg/m    Physical Exam Vitals reviewed.  Constitutional:      Appearance: Normal appearance.  Cardiovascular:     Rate and Rhythm: Normal rate and regular rhythm.  Pulmonary:     Effort: Pulmonary effort is normal.     Breath sounds: Normal breath sounds.  Skin:    Findings: Rash present.     Comments: She has somewhat well demarcated slightly raised hyperkeratotic plaque-like lesions on her legs bilaterally.  Slightly erythematous base.  Neurological:     Mental Status: She is alert.      No results found for any visits on 01/15/22.    The 10-year ASCVD risk score (Arnett  DK, et al., 2019) is: 2.9%    Assessment & Plan:   #1 history of weight gain.  She is now on Qsymia and doing well with reduction in migraine headaches and also has lost weight to near her goal.  Continue healthy lifestyle habits with regular exercise which she is currently doing.  We did discuss the fact that this is the top dose of Qsymia and cannot titrate any further  #2 disseminated superficial actinic porokeratosis.  This was diagnosed by dermatology.  She has not responded to traditional topicals.  She has researched that topical lovastatin can be very effective.  We will look at possibility of getting this through a local compounding pharmacy  Return in about 6 months (around 07/18/2022).    Evelena Peat, MD

## 2022-02-22 ENCOUNTER — Telehealth: Payer: Self-pay | Admitting: Family Medicine

## 2022-02-22 NOTE — Telephone Encounter (Signed)
Pt call and want maykal to give her a call back about filling out a form.

## 2022-02-23 NOTE — Telephone Encounter (Signed)
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 Patient will bring forms Monday for review. Patient also requested refill on Topical Lovastatin.

## 2022-02-23 NOTE — Telephone Encounter (Signed)
Medication has been refilled at [facility]

## 2022-02-23 NOTE — Telephone Encounter (Signed)
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 Wants to know if she needs to come in for an appointment or can the doctor fill the form. Does not want to come in.

## 2022-02-26 ENCOUNTER — Telehealth: Payer: Self-pay | Admitting: Family Medicine

## 2022-02-26 NOTE — Telephone Encounter (Signed)
Patient dropped off paperwork to be filled out regarding latex allergy. 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 Paperwork placed in folder for completion.      Please advise

## 2022-02-27 NOTE — Telephone Encounter (Signed)
Patient aware and forms placed in front office

## 2022-03-13 ENCOUNTER — Ambulatory Visit (INDEPENDENT_AMBULATORY_CARE_PROVIDER_SITE_OTHER): Payer: BC Managed Care – PPO | Admitting: Family Medicine

## 2022-03-13 ENCOUNTER — Encounter: Payer: Self-pay | Admitting: Family Medicine

## 2022-03-13 VITALS — BP 130/80 | HR 85 | Temp 97.7°F | Ht 61.0 in | Wt 115.6 lb

## 2022-03-13 DIAGNOSIS — Z8639 Personal history of other endocrine, nutritional and metabolic disease: Secondary | ICD-10-CM | POA: Diagnosis not present

## 2022-03-13 DIAGNOSIS — E876 Hypokalemia: Secondary | ICD-10-CM | POA: Diagnosis not present

## 2022-03-13 DIAGNOSIS — R300 Dysuria: Secondary | ICD-10-CM | POA: Diagnosis not present

## 2022-03-13 LAB — POC URINALSYSI DIPSTICK (AUTOMATED)
Bilirubin, UA: NEGATIVE
Blood, UA: POSITIVE
Glucose, UA: NEGATIVE
Ketones, UA: NEGATIVE
Nitrite, UA: POSITIVE
Protein, UA: NEGATIVE
Spec Grav, UA: 1.015 (ref 1.010–1.025)
Urobilinogen, UA: 1 E.U./dL
pH, UA: 6 (ref 5.0–8.0)

## 2022-03-13 LAB — CBC WITH DIFFERENTIAL/PLATELET
Basophils Absolute: 0.1 10*3/uL (ref 0.0–0.1)
Basophils Relative: 1.2 % (ref 0.0–3.0)
Eosinophils Absolute: 0.1 10*3/uL (ref 0.0–0.7)
Eosinophils Relative: 0.9 % (ref 0.0–5.0)
HCT: 42.1 % (ref 36.0–46.0)
Hemoglobin: 14.2 g/dL (ref 12.0–15.0)
Lymphocytes Relative: 22.7 % (ref 12.0–46.0)
Lymphs Abs: 1.4 10*3/uL (ref 0.7–4.0)
MCHC: 33.6 g/dL (ref 30.0–36.0)
MCV: 91.3 fl (ref 78.0–100.0)
Monocytes Absolute: 0.5 10*3/uL (ref 0.1–1.0)
Monocytes Relative: 7.4 % (ref 3.0–12.0)
Neutro Abs: 4.3 10*3/uL (ref 1.4–7.7)
Neutrophils Relative %: 67.8 % (ref 43.0–77.0)
Platelets: 317 10*3/uL (ref 150.0–400.0)
RBC: 4.61 Mil/uL (ref 3.87–5.11)
RDW: 13 % (ref 11.5–15.5)
WBC: 6.4 10*3/uL (ref 4.0–10.5)

## 2022-03-13 LAB — BASIC METABOLIC PANEL
BUN: 19 mg/dL (ref 6–23)
CO2: 27 mEq/L (ref 19–32)
Calcium: 9.8 mg/dL (ref 8.4–10.5)
Chloride: 102 mEq/L (ref 96–112)
Creatinine, Ser: 0.74 mg/dL (ref 0.40–1.20)
GFR: 91.21 mL/min (ref >60.00)
Glucose, Bld: 79 mg/dL (ref 70–99)
Potassium: 3.3 mEq/L — ABNORMAL LOW (ref 3.5–5.1)
Sodium: 139 mEq/L (ref 135–145)

## 2022-03-13 MED ORDER — NITROFURANTOIN MONOHYD MACRO 100 MG PO CAPS: 100.0000 mg | ORAL_CAPSULE | Freq: Two times a day (BID) | ORAL | 0 refills | Status: DC

## 2022-03-13 NOTE — Progress Notes (Signed)
Established Patient Office Visit  Subjective   Patient ID: Jamie Cameron, female    DOB: 1965/11/12  Age: 56 y.o. MRN: [id number]  Chief Complaint  Patient presents with   Labs Only   Bleeding/Bruising    X3 weeks    HPI   Jamie Cameron is seen for several issues as follows  Urinary symptoms of frequency past few days.  No actual burning.  No flank pain.  No nausea or vomiting.  No hematuria.  She states that she feels like she has difficulty emptying her bladder.  She is concerned about possible UTI.  Feels like she may have gotten dehydrated.  Started over-the-counter Pyridium past couple of days.  Urine discolored naturally from that.  No recent UTI.  Does have a history of UTIs in the past.  Second issues she has history of slightly low potassium.  Does not take any diuretics.  Last potassium 3.4.  She is requesting recheck.  She has occasional tingling in her fingers is concerned this may be a manifestation.  Occasional dizziness and lightheadedness and has had the symptoms previously with low potassium  Past history of iron deficiency.  She is postmenopausal.  No recent bloody stools.  Requesting repeat iron studies and ferritin.  She states she has family history of low iron.  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  We started topical statin compound recently which is helping tremendously.  She is pleased with the results.  Past Medical History:  Diagnosis Date   Allergy    Anemia    Asthma    COVID-19 virus infection    Endometriosis    Hypokalemia    Left ovarian cyst    See Epic pelvic US 03/03/19.   Migraine    Pneumonia    Past Surgical History:  Procedure Laterality Date   BREAST SURGERY  1999   Left benign lymph node   CHOLECYSTECTOMY     FOOT SURGERY     PELVIC LAPAROSCOPY  1996   endometriosis    reports that she has never smoked.  She has never used smokeless tobacco. She reports that she does not drink alcohol and does not use drugs. family history includes Breast cancer in her maternal grandmother; Breast cancer (age of onset: 7) in her paternal aunt; Breast cancer (age of onset: 65) in her maternal aunt; Cancer in her maternal grandmother; Crohn's disease in her sister; Diabetes in her maternal grandfather and mother; Heart attack in her mother; Hypertension in her father and mother. Allergies  Allergen Reactions   Latex Hives, Shortness Of Breath, Itching, Swelling and Rash   Sulfacetamide Sodium Nausea And Vomiting   Prednisone Nausea And Vomiting    Review of Systems  Constitutional:  Negative for chills, fever and malaise/fatigue.  Eyes:  Negative for blurred vision.  Respiratory:  Negative for shortness of breath.   Cardiovascular:  Negative for chest pain.  Neurological:  Positive for dizziness and tingling. Negative for tremors, weakness and headaches.      Objective:     BP 130/80 (BP Location: Left Arm, Patient Position: Sitting, Cuff Size: Normal)   Pulse 85   Temp 97.7 F (36.5 C) (Oral)   Ht 5\' 1"  (1.549 m)   Wt 115 lb 9.6 oz (52.4 kg)   SpO2 96%   BMI 21.84 kg/m  BP Readings from Last 3 Encounters:  03/13/22 130/80  01/15/22 106/60  12/13/21 122/81   Wt Readings from Last 3 Encounters:  03/13/22 115 lb 9.6 oz (52.4 kg)  01/15/22 112 lb 4.8 oz (50.9 kg)  12/13/21 107 lb (48.5 kg)      Physical Exam Vitals reviewed.  Constitutional:      Appearance: She is well-developed.  Eyes:     Pupils: Pupils are equal, round, and reactive to light.  Neck:     Thyroid: No thyromegaly.     Vascular: No JVD.  Cardiovascular:     Rate and Rhythm: Normal rate and regular rhythm.     Heart sounds:     No gallop.  Pulmonary:     Effort: Pulmonary effort is normal. No respiratory distress.     Breath sounds: Normal breath sounds. No wheezing or rales.  Musculoskeletal:     Cervical back:  Neck supple.     Right lower leg: No edema.     Left lower leg: No edema.  Neurological:     Mental Status: She is alert.      Results for orders placed or performed in visit on 03/13/22  POCT Urinalysis Dipstick (Automated)  Result Value Ref Range   Color, UA Orange    Clarity, UA Cloudy    Glucose, UA Negative Negative   Bilirubin, UA Negative    Ketones, UA Negative    Spec Grav, UA 1.015 1.010 - 1.025   Blood, UA Positive    pH, UA 6.0 5.0 - 8.0   Protein, UA Negative Negative   Urobilinogen, UA 1.0 0.2 or 1.0 E.U./dL   Nitrite, UA Positive    Leukocytes, UA Trace (A) Negative      The 10-year ASCVD risk score (Arnett DK, et al., 2019) is: 4.3%    Assessment & Plan:   1# urine frequency.  Urine dipstick does show trace leukocytes along with nitrites and blood but she is on Pyridium which makes reliability of dipstick uncertain.  Urine culture sent.  Decided to cover empirically with Macrobid 1 twice daily for 5 days pending culture results.  Increase hydration.  #2 disseminated superficial actinic porokeratosis improved with topical statin  #3 history of iron deficiency anemia.  Patient requesting repeat iron studies.  She is postmenopausal.  Colonoscopy up-to-date.  We will check CBC along with iron studies  #4 dizziness and tingling in the fingers.  Recent B12 normal.  History of hypokalemia.  Recheck basic metabolic panel   No follow-ups on file.    Jamie Peat, MD

## 2022-03-14 LAB — IRON,TIBC AND FERRITIN PANEL
%SAT: 21 % (calc) (ref 16–45)
Ferritin: 10 ng/mL — ABNORMAL LOW (ref 16–232)
Iron: 85 ug/dL (ref 45–160)
TIBC: 404 mcg/dL (calc) (ref 250–450)

## 2022-03-14 LAB — URINE CULTURE
MICRO NUMBER:: 14031099
Result:: NO GROWTH
SPECIMEN QUALITY:: ADEQUATE

## 2022-03-14 MED ORDER — POTASSIUM CHLORIDE ER 10 MEQ PO TBCR: EXTENDED_RELEASE_TABLET | ORAL | 0 refills | Status: DC

## 2022-03-14 NOTE — Addendum Note (Signed)
Addended by: Nilda Riggs on: 03/14/2022 01:45 PM   Modules accepted: Orders

## 2022-03-14 NOTE — Addendum Note (Signed)
Addended by: Nilda Riggs on: 03/14/2022 12:37 PM   Modules accepted: Orders

## 2022-03-16 NOTE — Telephone Encounter (Signed)
Pt called to ask CMA about a change in her medication, as discussed in a previous conversation.  CMA was unavailable.  Pt asking that CMA call back as soon as possible.

## 2022-03-16 NOTE — Telephone Encounter (Signed)
Please see result note 

## 2022-04-23 ENCOUNTER — Other Ambulatory Visit: Payer: BC Managed Care – PPO

## 2022-04-23 DIAGNOSIS — E876 Hypokalemia: Secondary | ICD-10-CM

## 2022-04-23 LAB — BASIC METABOLIC PANEL
BUN: 18 mg/dL (ref 6–23)
CO2: 27 mEq/L (ref 19–32)
Calcium: 8.8 mg/dL (ref 8.4–10.5)
Chloride: 105 mEq/L (ref 96–112)
Creatinine, Ser: 0.68 mg/dL (ref 0.40–1.20)
GFR: 98.1 mL/min (ref >60.00)
Glucose, Bld: 91 mg/dL (ref 70–99)
Potassium: 3.4 mEq/L — ABNORMAL LOW (ref 3.5–5.1)
Sodium: 140 mEq/L (ref 135–145)

## 2022-05-02 ENCOUNTER — Other Ambulatory Visit: Payer: Self-pay

## 2022-05-02 DIAGNOSIS — E876 Hypokalemia: Secondary | ICD-10-CM

## 2022-05-02 DIAGNOSIS — Z780 Asymptomatic menopausal state: Secondary | ICD-10-CM

## 2022-05-02 DIAGNOSIS — Z8639 Personal history of other endocrine, nutritional and metabolic disease: Secondary | ICD-10-CM

## 2022-05-02 MED ORDER — POTASSIUM CHLORIDE CRYS ER 20 MEQ PO TBCR: 20.0000 meq | EXTENDED_RELEASE_TABLET | Freq: Every day | ORAL | 1 refills | Status: DC

## 2022-05-08 DIAGNOSIS — J209 Acute bronchitis, unspecified: Secondary | ICD-10-CM | POA: Diagnosis not present

## 2022-05-18 DIAGNOSIS — R030 Elevated blood-pressure reading, without diagnosis of hypertension: Secondary | ICD-10-CM | POA: Diagnosis not present

## 2022-05-18 DIAGNOSIS — U071 COVID-19: Secondary | ICD-10-CM | POA: Diagnosis not present

## 2022-08-07 ENCOUNTER — Telehealth: Payer: Self-pay | Admitting: Family Medicine

## 2022-08-07 ENCOUNTER — Other Ambulatory Visit: Payer: 59

## 2022-08-07 DIAGNOSIS — E876 Hypokalemia: Secondary | ICD-10-CM

## 2022-08-07 DIAGNOSIS — Z8639 Personal history of other endocrine, nutritional and metabolic disease: Secondary | ICD-10-CM

## 2022-08-07 NOTE — Telephone Encounter (Signed)
Patient was here for blood draw this afternoon and inquired if we can add potassium labs to her magnesium and ferritin panel?

## 2022-08-07 NOTE — Addendum Note (Signed)
Addended by: Rosalyn Gess D on: 08/07/2022 04:00 PM   Modules accepted: Orders

## 2022-08-07 NOTE — Telephone Encounter (Signed)
Pt requesting refill of an ointment prescribed for her leg, did not see ointment in her med history.  [place], [place] Phone: [phone]  Fax: [phone]

## 2022-08-08 LAB — BASIC METABOLIC PANEL
BUN: 17 mg/dL (ref 6–23)
CO2: 30 mEq/L (ref 19–32)
Calcium: 9.5 mg/dL (ref 8.4–10.5)
Chloride: 103 mEq/L (ref 96–112)
Creatinine, Ser: 0.79 mg/dL (ref 0.40–1.20)
GFR: 84.09 mL/min (ref >60.00)
Glucose, Bld: 63 mg/dL — ABNORMAL LOW (ref 70–99)
Potassium: 3.4 mEq/L — ABNORMAL LOW (ref 3.5–5.1)
Sodium: 142 mEq/L (ref 135–145)

## 2022-08-08 LAB — MAGNESIUM: Magnesium: 2 mg/dL (ref 1.5–2.5)

## 2022-08-08 LAB — FERRITIN: Ferritin: 11.2 ng/mL (ref 10.0–291.0)

## 2022-08-08 NOTE — Telephone Encounter (Signed)
Pt is returning Jamie Cameron call 

## 2022-08-08 NOTE — Addendum Note (Signed)
Addended by: Nilda Riggs on: 08/08/2022 08:36 AM   Modules accepted: Orders

## 2022-08-08 NOTE — Telephone Encounter (Signed)
BMP added and left message for patient to return my call in regards to prescription

## 2022-08-08 NOTE — Telephone Encounter (Signed)
I spoke with the pharmacist at [facility] and medication has been called in.

## 2022-08-24 ENCOUNTER — Encounter: Payer: Self-pay | Admitting: Family Medicine

## 2022-08-24 DIAGNOSIS — Z8639 Personal history of other endocrine, nutritional and metabolic disease: Secondary | ICD-10-CM

## 2022-08-25 MED ORDER — TAZAROTENE 0.1 % EX CREA: TOPICAL_CREAM | Freq: Every day | CUTANEOUS | 1 refills | Status: AC

## 2022-08-25 MED ORDER — QSYMIA 15-92 MG PO CP24: 1.0000 | ORAL_CAPSULE | Freq: Every day | ORAL | 5 refills | Status: DC

## 2022-08-25 NOTE — Telephone Encounter (Signed)
I sent in refills for both medications.  Recommend follow up ferritin and BMP in about 2 months  Eulas Post MD [facility]

## 2022-08-27 NOTE — Addendum Note (Signed)
Addended by: Nilda Riggs on: 08/27/2022 08:08 AM   Modules accepted: Orders

## 2022-08-28 ENCOUNTER — Ambulatory Visit: Payer: 59 | Admitting: Family

## 2022-08-28 DIAGNOSIS — R300 Dysuria: Secondary | ICD-10-CM | POA: Diagnosis not present

## 2022-08-28 NOTE — Progress Notes (Deleted)
Patient ID: Jamie Cameron, female    DOB: 1965/10/09, 58 y.o.   MRN: [id number]  No chief complaint on file.   HPI:           Assessment & Plan:     Subjective:    Outpatient Medications Prior to Visit  Medication Sig Dispense Refill   acetaminophen (TYLENOL) 500 MG tablet Take 500 mg by mouth every 6 (six) hours as needed.     Calcium Carbonate-Vitamin D (CALCIUM + D PO) Take by mouth.     cyanocobalamin (,VITAMIN B-12,) 1000 MCG/ML injection Inject 1cc intermuscular once weekly for 4 (four) weeks, then once a month, 10 mL 0   ibuprofen (ADVIL,MOTRIN) 800 MG tablet Take 1 tablet (800 mg total) by mouth every 8 (eight) hours as needed. 30 tablet 1   nitrofurantoin, macrocrystal-monohydrate, (MACROBID) 100 MG capsule Take 1 capsule (100 mg total) by mouth 2 (two) times daily. 10 capsule 0   ondansetron (ZOFRAN-ODT) 8 MG disintegrating tablet DISSOLVE 1 TABLET(8 MG) ON THE TONGUE EVERY 8 HOURS AS NEEDED FOR NAUSEA OR VOMITING 20 tablet 0   Phentermine-Topiramate (QSYMIA) 15-92 MG CP24 Take 1 capsule by mouth daily. 30 capsule 5   potassium chloride SA (KLOR-CON M) 20 MEQ tablet Take 1 tablet (20 mEq total) by mouth daily. 90 tablet 1   PROAIR HFA 108 (90 BASE) MCG/ACT inhaler as needed.     rizatriptan (MAXALT-MLT) 10 MG disintegrating tablet DISSOLVE 1 TABLET BY MOUTH AS NEEDED FOR MIGRAINE, MAY REPEAT IN 2 HOURS IF NEEDED 20 tablet 2   SUMAtriptan (IMITREX) 100 MG tablet TAKE 1 TABLET BY MOUTH EVERY 2 HOURS AS NEEDED FOR MIGRAINE. MAY REPEAT IN 2 HOURS IF HEADACHE PERSISTS OR RECURS 20 tablet 2   SYMBICORT 80-4.5 MCG/ACT inhaler Inhale 2 puffs into the lungs 2 (two) times daily.     tazarotene (TAZORAC) 0.1 % cream Apply topically at bedtime. 30 g 1   No facility-administered medications prior to visit.   Past Medical History:  Diagnosis Date   Allergy    Anemia    Asthma    COVID-19 virus infection    Endometriosis    Hypokalemia    Left ovarian cyst    See Epic pelvic  US 03/03/19.   Migraine    Pneumonia    Past Surgical History:  Procedure Laterality Date   BREAST SURGERY  1999   Left benign lymph node   CHOLECYSTECTOMY     FOOT SURGERY     PELVIC LAPAROSCOPY  1996   endometriosis   Allergies  Allergen Reactions   Latex Hives, Shortness Of Breath, Itching, Swelling and Rash   Sulfacetamide Sodium Nausea And Vomiting   Prednisone Nausea And Vomiting      Objective:    Physical Exam Vitals and nursing note reviewed.  Constitutional:      Appearance: Normal appearance.  Cardiovascular:     Rate and Rhythm: Normal rate and regular rhythm.  Pulmonary:     Effort: Pulmonary effort is normal.     Breath sounds: Normal breath sounds.  Musculoskeletal:        General: Normal range of motion.  Skin:    General: Skin is warm and dry.  Neurological:     Mental Status: She is alert.  Psychiatric:        Mood and Affect: Mood normal.        Behavior: Behavior normal.    There were no vitals taken for this visit. Wt Readings  from Last 3 Encounters:  03/13/22 115 lb 9.6 oz (52.4 kg)  01/15/22 112 lb 4.8 oz (50.9 kg)  12/13/21 107 lb (48.5 kg)       Jeanie Sewer, NP

## 2022-08-29 ENCOUNTER — Other Ambulatory Visit: Payer: Self-pay | Admitting: Internal Medicine

## 2022-08-29 ENCOUNTER — Ambulatory Visit: Payer: 59 | Admitting: Internal Medicine

## 2022-08-29 ENCOUNTER — Telehealth: Payer: Self-pay | Admitting: Family Medicine

## 2022-08-29 ENCOUNTER — Encounter: Payer: Self-pay | Admitting: Internal Medicine

## 2022-08-29 ENCOUNTER — Ambulatory Visit (HOSPITAL_BASED_OUTPATIENT_CLINIC_OR_DEPARTMENT_OTHER)
Admission: RE | Admit: 2022-08-29 | Discharge: 2022-08-29 | Disposition: A | Discharge status: Home / Self Care | Payer: 59 | Source: Ambulatory Visit | Attending: Internal Medicine | Admitting: Internal Medicine

## 2022-08-29 VITALS — BP 120/86 | HR 108 | Temp 97.8°F | Ht 61.0 in | Wt 113.4 lb

## 2022-08-29 DIAGNOSIS — R109 Unspecified abdominal pain: Secondary | ICD-10-CM

## 2022-08-29 DIAGNOSIS — R310 Gross hematuria: Secondary | ICD-10-CM | POA: Diagnosis not present

## 2022-08-29 DIAGNOSIS — M549 Dorsalgia, unspecified: Secondary | ICD-10-CM

## 2022-08-29 DIAGNOSIS — N200 Calculus of kidney: Secondary | ICD-10-CM

## 2022-08-29 LAB — POCT URINALYSIS DIPSTICK
Bilirubin, UA: NEGATIVE
Blood, UA: NEGATIVE
Glucose, UA: NEGATIVE
Ketones, UA: NEGATIVE
Leukocytes, UA: NEGATIVE
Nitrite, UA: NEGATIVE
Protein, UA: POSITIVE — AB
Spec Grav, UA: 1.015 (ref 1.010–1.025)
Urobilinogen, UA: 0.2 E.U./dL
pH, UA: 6 (ref 5.0–8.0)

## 2022-08-29 MED ORDER — TRAMADOL HCL 50 MG PO TABS: 50.0000 mg | ORAL_TABLET | Freq: Three times a day (TID) | ORAL | 0 refills | Status: AC | PRN

## 2022-08-29 NOTE — Progress Notes (Signed)
Referral  for  acute flank abd pian hematuria  seen uc yesterday culture pending  patient suspect stones.  On toradol keflex and flomax  Referrel to [facility] asap. Fraser Din can also call

## 2022-08-29 NOTE — Telephone Encounter (Signed)
Pt is at [facility] now and ct renal need to be [place]

## 2022-08-29 NOTE — Progress Notes (Signed)
Chief Complaint  Patient presents with   Hematuria   Back Pain   Flank Pain    Pt reports hx of kidney stone. Sx of right side pain and back pain started on Saturday. Blood in urine. Went to walk in clinic yesterday, was given todorol shot, flomax and keflex. Took toradol, flomax and keflex today. Drink 1087ml fluid. Not getting better. Unable to urinate.     HPI: Jamie Cameron 57 y.o. come in for sda    dr Elease Hashimoto PCP Seen uc yesterday for cystitis  prob reanal stone ? Given tordal keflex  flomax . Saturday pain and then  blood  in urine and  now feels like in now  fever  And hard to urinate  today   can self cath . Thinks the stone is moving  Lower .  R side pain  no fever  On keflex  today .  ROS: See pertinent positives and negatives per HPI.  Past Medical History:  Diagnosis Date   Allergy    Anemia    Asthma    COVID-19 virus infection    Endometriosis    Hypokalemia    Left ovarian cyst    See Epic pelvic US 03/03/19.   Migraine    Pneumonia     Family History  Problem Relation Age of Onset   Hypertension Mother    Diabetes Mother    Heart attack Mother    Hypertension Father    Crohn's disease Sister    Breast cancer Maternal Aunt 42   Breast cancer Paternal Aunt 56   Cancer Maternal Grandmother        cervical   Breast cancer Maternal Grandmother        70's   Diabetes Maternal Grandfather    Colon cancer Neg Hx    Cystic fibrosis Neg Hx    Esophageal cancer Neg Hx    Rectal cancer Neg Hx    Stomach cancer Neg Hx     Social History   Socioeconomic History   Marital status: Married    Spouse name: Not on file   Number of children: Not on file   Years of education: Not on file   Highest education level: Not on file  Occupational History   Not on file  Tobacco Use   Smoking status: Never   Smokeless tobacco: Never  Vaping Use   Vaping Use: Never used  Substance and Sexual Activity   Alcohol use: No    Alcohol/week: 0.0 standard drinks  of alcohol   Drug use: No   Sexual activity: Yes    Partners: Male    Birth control/protection: None  Other Topics Concern   Not on file  Social History Narrative   Not on file   Social Determinants of Health   Financial Resource Strain: Not on file  Food Insecurity: Not on file  Transportation Needs: Not on file  Physical Activity: Not on file  Stress: Not on file  Social Connections: Not on file    Outpatient Medications Prior to Visit  Medication Sig Dispense Refill   acetaminophen (TYLENOL) 500 MG tablet Take 500 mg by mouth every 6 (six) hours as needed.     Calcium Carbonate-Vitamin D (CALCIUM + D PO) Take by mouth.     cephALEXin (KEFLEX) 500 MG capsule Take by mouth.     cyanocobalamin (,VITAMIN B-12,) 1000 MCG/ML injection Inject 1cc intermuscular once weekly for 4 (four) weeks, then once a month, 10 mL  0   ibuprofen (ADVIL,MOTRIN) 800 MG tablet Take 1 tablet (800 mg total) by mouth every 8 (eight) hours as needed. 30 tablet 1   ketorolac (TORADOL) 10 MG tablet Take by mouth.     nitrofurantoin, macrocrystal-monohydrate, (MACROBID) 100 MG capsule Take 1 capsule (100 mg total) by mouth 2 (two) times daily. 10 capsule 0   ondansetron (ZOFRAN-ODT) 8 MG disintegrating tablet DISSOLVE 1 TABLET(8 MG) ON THE TONGUE EVERY 8 HOURS AS NEEDED FOR NAUSEA OR VOMITING 20 tablet 0   Phentermine-Topiramate (QSYMIA) 15-92 MG CP24 Take 1 capsule by mouth daily. 30 capsule 5   potassium chloride SA (KLOR-CON M) 20 MEQ tablet Take 1 tablet (20 mEq total) by mouth daily. 90 tablet 1   PROAIR HFA 108 (90 BASE) MCG/ACT inhaler as needed.     rizatriptan (MAXALT-MLT) 10 MG disintegrating tablet DISSOLVE 1 TABLET BY MOUTH AS NEEDED FOR MIGRAINE, MAY REPEAT IN 2 HOURS IF NEEDED 20 tablet 2   SUMAtriptan (IMITREX) 100 MG tablet TAKE 1 TABLET BY MOUTH EVERY 2 HOURS AS NEEDED FOR MIGRAINE. MAY REPEAT IN 2 HOURS IF HEADACHE PERSISTS OR RECURS 20 tablet 2   SYMBICORT 80-4.5 MCG/ACT inhaler Inhale 2  puffs into the lungs 2 (two) times daily.     tamsulosin (FLOMAX) 0.4 MG CAPS capsule Take by mouth.     tazarotene (TAZORAC) 0.1 % cream Apply topically at bedtime. 30 g 1   No facility-administered medications prior to visit.     EXAM:  BP 120/86 (BP Location: Left Arm, Patient Position: Sitting, Cuff Size: Normal)   Pulse (!) 108   Temp 97.8 F (36.6 C) (Oral)   Ht 5\' 1"  (1.549 m)   Wt 113 lb 6.4 oz (51.4 kg)   SpO2 100%   BMI 21.43 kg/m   Body mass index is 21.43 kg/m.  GENERAL: vitals reviewed and listed above, alert, oriented, appears well hydrated and in no acute distress in mod distress pain  non toxic  HEENT: atraumatic, conjunctiva  clear, no obvious abnormalities on inspection of external nose and ears    NECK: no obvious masses on inspection palpation  LUNGS: clear to auscultation bilaterally, no wheezes, rales or rhonchi, good air movement CV: HRRR, no clubbing cyanosis or  peripheral edema nl cap refill  Abdomen:  Sof,t normal bowel sounds without hepatosplenomegaly, righ tlow back pain and r l abd pain   no guarding  rebound or mass  neg psoas signe   MS: moves all extremities without noticeable focal  abnormality PSYCH: pleasant and cooperative, Lab Results  Component Value Date   WBC 6.4 03/13/2022   HGB 14.2 03/13/2022   HCT 42.1 03/13/2022   PLT 317.0 03/13/2022   GLUCOSE 63 (L) 08/07/2022   CHOL 216 (H) 11/10/2020   TRIG 102 11/10/2020   HDL 70 11/10/2020   LDLCALC 126 (H) 11/10/2020   ALT 13 06/21/2021   AST 16 06/21/2021   NA 142 08/07/2022   K 3.4 (L) 08/07/2022   CL 103 08/07/2022   CREATININE 0.79 08/07/2022   BUN 17 08/07/2022   CO2 30 08/07/2022   TSH 1.06 06/21/2021   BP Readings from Last 3 Encounters:  08/29/22 120/86  03/13/22 130/80  01/15/22 106/60  Record review .  ASSESSMENT AND PLAN:  Discussed the following assessment and plan:  Flank pain - Plan: CT RENAL STONE STUDY  Back pain, unspecified back location,  unspecified back pain laterality, unspecified chronicity - Plan: POC Urinalysis Dipstick, CT RENAL STONE STUDY  Gross hematuria - Plan: CT RENAL STONE STUDY  Right renal stone - Plan: CT RENAL STONE STUDY Acute pain   suspect  stone plus poss infection and obstructive sx .  Cath set to patient .  Order  state ct .   -Patient advised to return or notify health care team  if  new concerns arise.  There are no Patient Instructions on file for this visit.   Standley Brooking. Sharalee Witman M.D.

## 2022-08-29 NOTE — Telephone Encounter (Signed)
Auth needed for referral [id number]

## 2022-08-30 NOTE — Progress Notes (Signed)
So scan did not show abnormality stones renal   would want to know what urine culture shows but dont see this in the EPIC system . Are  y ou sure it was done?  Urine from yesterday look pretty clear and we didn't re culture .  Stay on antibiotic and if uti sx ongoing would re culture  but best to be seen with urology. I placed order for [facility] yesterday

## 2022-09-03 DIAGNOSIS — N201 Calculus of ureter: Secondary | ICD-10-CM | POA: Diagnosis not present

## 2022-09-03 DIAGNOSIS — R3121 Asymptomatic microscopic hematuria: Secondary | ICD-10-CM | POA: Diagnosis not present

## 2022-09-10 ENCOUNTER — Telehealth: Payer: Self-pay

## 2022-09-10 NOTE — Telephone Encounter (Signed)
Received a notification from [employer]. Stating a denied service for CT.    "We cannot approve this request because: You must have results of a retroperitoneal ultrasound that support further imaging.   You must have results of a KUB(x-ray, ureter and bladder) that supports further imaging. "   Please advise.

## 2022-09-11 DIAGNOSIS — Z113 Encounter for screening for infections with a predominantly sexual mode of transmission: Secondary | ICD-10-CM | POA: Diagnosis not present

## 2022-09-11 DIAGNOSIS — Z124 Encounter for screening for malignant neoplasm of cervix: Secondary | ICD-10-CM | POA: Diagnosis not present

## 2022-09-11 DIAGNOSIS — Z01411 Encounter for gynecological examination (general) (routine) with abnormal findings: Secondary | ICD-10-CM | POA: Diagnosis not present

## 2022-09-11 DIAGNOSIS — Z01419 Encounter for gynecological examination (general) (routine) without abnormal findings: Secondary | ICD-10-CM | POA: Diagnosis not present

## 2022-09-12 NOTE — Telephone Encounter (Signed)
Will defer for when PCP CMA back in office.

## 2022-09-12 NOTE — Telephone Encounter (Signed)
I dont agree  with this  assessment :  she had abdominal pain and hematuria and dysuria   and hx of renal stones . Please appeal  and see why this was denied .

## 2022-09-12 NOTE — Telephone Encounter (Signed)
Pt called to say she went to [facility] and after being seen, was told they can see 2 stones.  Ef-ax with office visit report from [facility] was already sent to Pt's chart.  Also, Pt was told by [facility] that her insurance denied the:  tazarotene (TAZORAC) 0.1 % cream   Pt would like to know if MD could please send an alternative Rx to:  [id number] - [place], [place] - [address] Phone: [phone]  Fax: [phone]     Along with documentation to justify need for this topical.   Pt informed MD is OOO until June.  Pt is asking if another provider can assist with this refill?  Pt informed that this message would be sent to Dr. Fabian Sharp, since she is the MD that saw her last.   Please advise.    LOV:  08/29/22

## 2022-09-14 NOTE — Telephone Encounter (Signed)
I spoke with the patient and she reported that she will wait until PCP is back in office to discuss medication as she does not want another provider involved with prescribing this medication.

## 2022-10-18 ENCOUNTER — Telehealth: Payer: Self-pay | Admitting: Internal Medicine

## 2022-10-18 ENCOUNTER — Other Ambulatory Visit: Payer: 59

## 2022-10-18 DIAGNOSIS — Z8639 Personal history of other endocrine, nutritional and metabolic disease: Secondary | ICD-10-CM

## 2022-10-18 LAB — BASIC METABOLIC PANEL
BUN: 18 mg/dL (ref 6–23)
CO2: 25 mEq/L (ref 19–32)
Calcium: 9.7 mg/dL (ref 8.4–10.5)
Chloride: 107 mEq/L (ref 96–112)
Creatinine, Ser: 0.77 mg/dL (ref 0.40–1.20)
GFR: 86.6 mL/min (ref >60.00)
Glucose, Bld: 88 mg/dL (ref 70–99)
Potassium: 3.6 mEq/L (ref 3.5–5.1)
Sodium: 142 mEq/L (ref 135–145)

## 2022-10-18 LAB — FERRITIN: Ferritin: 26.2 ng/mL (ref 10.0–291.0)

## 2022-10-18 NOTE — Telephone Encounter (Signed)
Pt would like to check on the status of the disputed CT scan. She states her insurance company of refusing to pay for the scan and she received a bill in April for the scan. She states Dr. Fabian Sharp was going to send a letter to the insurance company. Pt would like an update and whether the letter was sent to the insurance company. She would like an update either through MyChart or phone call.

## 2022-11-01 DIAGNOSIS — Z1231 Encounter for screening mammogram for malignant neoplasm of breast: Secondary | ICD-10-CM | POA: Diagnosis not present

## 2022-11-28 NOTE — Telephone Encounter (Signed)
2nd attempted to reach Arlet B with [employer]. Left voicemail.

## 2022-11-28 NOTE — Telephone Encounter (Signed)
Attempted to reach [employer] to follow up. Left a voicemail to call us back.

## 2022-11-30 NOTE — Telephone Encounter (Signed)
Contact pt insurance to follow up on the appeal. They updates the decision was made yesterday. Resolution letter has not posted yet. It may takes 24-48 hrs. Also the resolution will send out to pt only.   Case number:[id number] [phone]

## 2022-12-07 ENCOUNTER — Encounter: Payer: Self-pay | Admitting: Family Medicine

## 2022-12-07 ENCOUNTER — Ambulatory Visit: Payer: 59 | Admitting: Family Medicine

## 2022-12-07 VITALS — BP 124/80 | HR 105 | Temp 97.6°F | Ht 61.0 in | Wt 106.1 lb

## 2022-12-07 DIAGNOSIS — L565 Disseminated superficial actinic porokeratosis (DSAP): Secondary | ICD-10-CM | POA: Diagnosis not present

## 2022-12-07 DIAGNOSIS — E785 Hyperlipidemia, unspecified: Secondary | ICD-10-CM | POA: Diagnosis not present

## 2022-12-07 DIAGNOSIS — I998 Other disorder of circulatory system: Secondary | ICD-10-CM | POA: Diagnosis not present

## 2022-12-07 NOTE — Progress Notes (Signed)
Established Patient Office Visit  Subjective   Patient ID: Jamie Cameron, female    DOB: 05-17-1966  Age: 57 y.o. MRN: [id number]  Chief Complaint  Patient presents with   Hypertension    HPI   Jykeria has history of asthma, endometriosis, migraine headaches, IBS. She is seen today with concerns for elevated blood pressure.  She has recently had some elevated diastolic readings up over 110 on a few occasions.  Some of these have occurred at work.  She feels confident with the accuracy of readings.  She had 1 recently that was 144/115.  She has had some recent increase stress.  Her mom died recently age 79 complications of diabetes, atrial fibrillation, and possibly complications of NASH.  Anasofia's weight is down today and she is no longer taking Qsymia.  She states she has had difficulty with sleep usually,  getting about 4 hours per night.  No alcohol.  No recent nonsteroidal use.  Only occasional migraines but no consistent headaches.  Occasional mild late day ankle edema.  She has no history of hypertension.  Her blood pressure is actually fairly well-controlled today.  Does not take any supplements such as Sudafed  She has history of disseminated superficial actinic porokeratosis.  She has used topical lov[facility]atin [facility] in the past for this  The 10-year ASCVD risk score (Arnett DK, et al., 2019) is: 3.9%   Values used to calculate the score:     Age: 36 years     Sex: Female     Is Non-Hispanic African American: No     Diabetic: No     Tobacco smoker: Yes     Systolic Blood Pressure: 124 mmHg     Is BP treated: No     HDL Cholesterol: 70 mg/dL     Total Cholesterol: 216 mg/dL   Past Medical History:  Diagnosis Date   Allergy    Anemia    Asthma    COVID-19 virus infection    Endometriosis    Hypokalemia    Left ovarian cyst    See Epic pelvic US 03/03/19.   Migraine    Pneumonia    Past Surgical History:  Procedure  Laterality Date   BREAST SURGERY  1999   Left benign lymph node   CHOLECYSTECTOMY     FOOT SURGERY     PELVIC LAPAROSCOPY  1996   endometriosis    reports that she has never smoked. She has never used smokeless tobacco. She reports that she does not drink alcohol and does not use drugs. family history includes Breast cancer in her maternal grandmother; Breast cancer (age of onset: 49) in her paternal aunt; Breast cancer (age of onset: 30) in her maternal aunt; Cancer in her maternal grandmother; Crohn's disease in her sister; Diabetes in her maternal grandfather and mother; Heart attack in her mother; Hypertension in her father and mother. Allergies  Allergen Reactions   Latex Hives, Shortness Of Breath, Itching, Swelling and Rash   Sulfacetamide Sodium Nausea And Vomiting   Prednisone Nausea And Vomiting    Review of Systems  Constitutional:  Negative for malaise/fatigue.  Eyes:  Negative for blurred vision.  Respiratory:  Negative for shortness of breath.   Cardiovascular:  Negative for chest pain.  Neurological:  Negative for dizziness, weakness and headaches.      Objective:     BP 124/80 (BP Location: Left Arm, Patient Position: Sitting, Cuff Size: Normal)   Pulse Marland Kitchen)  105   Temp 97.6 F (36.4 C) (Oral)   Ht 5\' 1"  (1.549 m)   Wt 106 lb 1.6 oz (48.1 kg)   SpO2 98%   BMI 20.05 kg/m  BP Readings from Last 3 Encounters:  12/07/22 124/80  08/29/22 120/86  03/13/22 130/80   Wt Readings from Last 3 Encounters:  12/07/22 106 lb 1.6 oz (48.1 kg)  08/29/22 113 lb 6.4 oz (51.4 kg)  03/13/22 115 lb 9.6 oz (52.4 kg)      Physical Exam Vitals reviewed.  Constitutional:      Appearance: She is well-developed.  Eyes:     Pupils: Pupils are equal, round, and reactive to light.  Neck:     Thyroid: No thyromegaly.     Vascular: No JVD.  Cardiovascular:     Rate and Rhythm: Normal rate and regular rhythm.     Heart sounds:     No gallop.  Pulmonary:     Effort:  Pulmonary effort is normal. No respiratory distress.     Breath sounds: Normal breath sounds. No wheezing or rales.  Musculoskeletal:     Cervical back: Neck supple.     Right lower leg: No edema.     Left lower leg: No edema.  Neurological:     Mental Status: She is alert.      No results found for any visits on 12/07/22.    The 10-year ASCVD risk score (Arnett DK, et al., 2019) is: 3.9%    Assessment & Plan:   #1 concern for elevated blood pressure by several home readings.  Blood pressure well-controlled today.  She has no significant risk factors for hypertension other than family history.  -We did discuss possible 24-hour ambulatory blood pressure monitor (especially given her widely fluctuating readings) and she would like to proceed along those lines -Continue to avoid alcohol and nonsteroidals -Continue close home monitoring -Continue low-sodium diet -Reluctant to start medication yet given good reading today here in office  #2 history of mild hyperlipidemia.  Previous LDL cholesterol 126.  Positive family history of CAD in her mother in her 66s.  We discussed possible coronary calcium score to further stratify and she would like to proceed with that  #3 disseminated superficial actinic porokeratosis.  She has rash involving lower extremities.  Has responded to topical lovastatin which she gets through compounded pharmacy and this will be refilled  Evelena Peat, MD

## 2022-12-07 NOTE — Patient Instructions (Signed)
I will be setting up 24 hour ambulatory BP monitor  I will also be setting up coronary calcium scan.

## 2022-12-14 ENCOUNTER — Telehealth: Payer: Self-pay | Admitting: Family Medicine

## 2022-12-14 DIAGNOSIS — R5383 Other fatigue: Secondary | ICD-10-CM

## 2022-12-14 NOTE — Telephone Encounter (Signed)
Pt would like MD to please put in orders for her to have a panel of labs done.   She states she has been feeling extremely fatigued, for some time now.  Pt also would like to know when//how she can get her blood pressure monitor?  Please advise.

## 2022-12-17 NOTE — Telephone Encounter (Signed)
Left detailed message informing the patient of the message below and to call to schedule lab appointment

## 2022-12-18 NOTE — Telephone Encounter (Signed)
Pt was scheduled for labs tomorrow morning.  Pt added that she may need a referral for her monitor, and would like a call back to discuss.

## 2022-12-18 NOTE — Telephone Encounter (Signed)
I spoke with the patient and reported that a referral is needed to Cardiology to have insurance pay for Blood pressure monitor.

## 2022-12-19 ENCOUNTER — Other Ambulatory Visit: Payer: 59

## 2022-12-19 DIAGNOSIS — R5383 Other fatigue: Secondary | ICD-10-CM

## 2022-12-19 DIAGNOSIS — R7401 Elevation of levels of liver transaminase levels: Secondary | ICD-10-CM

## 2022-12-19 LAB — CBC WITH DIFFERENTIAL/PLATELET
Basophils Absolute: 0.1 10*3/uL (ref 0.0–0.1)
Basophils Relative: 1.1 % (ref 0.0–3.0)
Eosinophils Absolute: 0.1 10*3/uL (ref 0.0–0.7)
Eosinophils Relative: 1.3 % (ref 0.0–5.0)
HCT: 40.3 % (ref 36.0–46.0)
Hemoglobin: 13.3 g/dL (ref 12.0–15.0)
Lymphocytes Relative: 23.3 % (ref 12.0–46.0)
Lymphs Abs: 1.6 10*3/uL (ref 0.7–4.0)
MCHC: 33.1 g/dL (ref 30.0–36.0)
MCV: 93.9 fl (ref 78.0–100.0)
Monocytes Absolute: 0.5 10*3/uL (ref 0.1–1.0)
Monocytes Relative: 7.4 % (ref 3.0–12.0)
Neutro Abs: 4.5 10*3/uL (ref 1.4–7.7)
Neutrophils Relative %: 66.9 % (ref 43.0–77.0)
Platelets: 307 10*3/uL (ref 150.0–400.0)
RBC: 4.29 Mil/uL (ref 3.87–5.11)
RDW: 13.3 % (ref 11.5–15.5)
WBC: 6.8 10*3/uL (ref 4.0–10.5)

## 2022-12-19 LAB — COMPREHENSIVE METABOLIC PANEL
ALT: 44 U/L — ABNORMAL HIGH (ref 0–35)
AST: 24 U/L (ref 0–37)
Albumin: 4.3 g/dL (ref 3.5–5.2)
Alkaline Phosphatase: 65 U/L (ref 39–117)
BUN: 21 mg/dL (ref 6–23)
CO2: 27 mEq/L (ref 19–32)
Calcium: 9.8 mg/dL (ref 8.4–10.5)
Chloride: 106 mEq/L (ref 96–112)
Creatinine, Ser: 0.89 mg/dL (ref 0.40–1.20)
GFR: 72.69 mL/min (ref >60.00)
Glucose, Bld: 73 mg/dL (ref 70–99)
Potassium: 3.8 mEq/L (ref 3.5–5.1)
Sodium: 141 mEq/L (ref 135–145)
Total Bilirubin: 0.4 mg/dL (ref 0.2–1.2)
Total Protein: 6.2 g/dL (ref 6.0–8.3)

## 2022-12-19 LAB — TSH: TSH: 1.11 u[IU]/mL (ref 0.35–5.50)

## 2022-12-19 LAB — C-REACTIVE PROTEIN: CRP: <1 mg/dL (ref 0.5–20.0)

## 2022-12-19 LAB — SEDIMENTATION RATE: Sed Rate: 1 mm/hr (ref 0–30)

## 2022-12-19 LAB — VITAMIN D 25 HYDROXY (VIT D DEFICIENCY, FRACTURES): VITD: 52.1 ng/mL (ref 30.00–100.00)

## 2022-12-19 NOTE — Telephone Encounter (Signed)
Patient informed of the message and voiced understanding.

## 2022-12-28 DIAGNOSIS — Z809 Family history of malignant neoplasm, unspecified: Secondary | ICD-10-CM | POA: Diagnosis not present

## 2022-12-28 DIAGNOSIS — Z823 Family history of stroke: Secondary | ICD-10-CM | POA: Diagnosis not present

## 2022-12-28 DIAGNOSIS — E785 Hyperlipidemia, unspecified: Secondary | ICD-10-CM | POA: Diagnosis not present

## 2022-12-28 DIAGNOSIS — Z882 Allergy status to sulfonamides status: Secondary | ICD-10-CM | POA: Diagnosis not present

## 2022-12-28 DIAGNOSIS — Z91040 Latex allergy status: Secondary | ICD-10-CM | POA: Diagnosis not present

## 2022-12-28 DIAGNOSIS — Z888 Allergy status to other drugs, medicaments and biological substances status: Secondary | ICD-10-CM | POA: Diagnosis not present

## 2022-12-28 DIAGNOSIS — Z833 Family history of diabetes mellitus: Secondary | ICD-10-CM | POA: Diagnosis not present

## 2022-12-28 DIAGNOSIS — Z8249 Family history of ischemic heart disease and other diseases of the circulatory system: Secondary | ICD-10-CM | POA: Diagnosis not present

## 2023-01-08 ENCOUNTER — Other Ambulatory Visit: Payer: Self-pay | Admitting: Family Medicine

## 2023-01-08 ENCOUNTER — Telehealth: Payer: Self-pay | Admitting: *Deleted

## 2023-01-08 DIAGNOSIS — E785 Hyperlipidemia, unspecified: Secondary | ICD-10-CM

## 2023-01-08 DIAGNOSIS — I998 Other disorder of circulatory system: Secondary | ICD-10-CM

## 2023-01-08 DIAGNOSIS — L565 Disseminated superficial actinic porokeratosis (DSAP): Secondary | ICD-10-CM

## 2023-01-08 DIAGNOSIS — R030 Elevated blood-pressure reading, without diagnosis of hypertension: Secondary | ICD-10-CM

## 2023-01-08 NOTE — Telephone Encounter (Signed)
Calling to schedule 24 hour ambulatory blood pressure monitor.  Brief description of test given.  Please call Vesna Kable/ [facility] at [phone] to schedule.

## 2023-01-14 ENCOUNTER — Ambulatory Visit (HOSPITAL_BASED_OUTPATIENT_CLINIC_OR_DEPARTMENT_OTHER)
Admission: RE | Admit: 2023-01-14 | Discharge: 2023-01-14 | Disposition: A | Discharge status: Home / Self Care | Payer: 59 | Source: Ambulatory Visit | Attending: Family Medicine | Admitting: Family Medicine

## 2023-01-14 DIAGNOSIS — E785 Hyperlipidemia, unspecified: Secondary | ICD-10-CM | POA: Insufficient documentation

## 2023-01-21 ENCOUNTER — Other Ambulatory Visit: Payer: 59

## 2023-01-21 ENCOUNTER — Telehealth: Payer: Self-pay | Admitting: Family Medicine

## 2023-01-21 DIAGNOSIS — Z8639 Personal history of other endocrine, nutritional and metabolic disease: Secondary | ICD-10-CM

## 2023-01-21 DIAGNOSIS — E876 Hypokalemia: Secondary | ICD-10-CM

## 2023-01-21 DIAGNOSIS — R7401 Elevation of levels of liver transaminase levels: Secondary | ICD-10-CM

## 2023-01-21 NOTE — Telephone Encounter (Signed)
Pt call and she want to know will dr.Burchette put in for  bmp and ferritin on the order that she for today.

## 2023-01-21 NOTE — Telephone Encounter (Signed)
Patient informed labs were placed  

## 2023-01-22 LAB — BASIC METABOLIC PANEL
BUN: 23 mg/dL (ref 6–23)
CO2: 20 meq/L (ref 19–32)
Calcium: 9.3 mg/dL (ref 8.4–10.5)
Chloride: 108 meq/L (ref 96–112)
Creatinine, Ser: 0.65 mg/dL (ref 0.40–1.20)
GFR: 98.66 mL/min (ref >60.00)
Glucose, Bld: 142 mg/dL — ABNORMAL HIGH (ref 70–99)
Potassium: 3.7 meq/L (ref 3.5–5.1)
Sodium: 138 meq/L (ref 135–145)

## 2023-01-22 LAB — HEPATIC FUNCTION PANEL
ALT: 26 U/L (ref 0–35)
AST: 21 U/L (ref 0–37)
Albumin: 4.4 g/dL (ref 3.5–5.2)
Alkaline Phosphatase: 67 U/L (ref 39–117)
Bilirubin, Direct: 0 mg/dL (ref 0.0–0.3)
Total Bilirubin: 0.3 mg/dL (ref 0.2–1.2)
Total Protein: 6.6 g/dL (ref 6.0–8.3)

## 2023-01-23 LAB — FERRITIN: Ferritin: 28.9 ng/mL (ref 10.0–291.0)

## 2023-04-23 ENCOUNTER — Encounter: Payer: 59 | Admitting: Family Medicine

## 2023-04-24 ENCOUNTER — Encounter: Payer: Self-pay | Admitting: Family Medicine

## 2023-04-24 ENCOUNTER — Ambulatory Visit: Payer: BC Managed Care – PPO | Admitting: Family Medicine

## 2023-04-24 VITALS — BP 130/82 | HR 80 | Temp 97.4°F | Ht 61.81 in | Wt 109.6 lb

## 2023-04-24 DIAGNOSIS — Z789 Other specified health status: Secondary | ICD-10-CM | POA: Diagnosis not present

## 2023-04-24 DIAGNOSIS — Z0000 Encounter for general adult medical examination without abnormal findings: Secondary | ICD-10-CM

## 2023-04-24 MED ORDER — ONDANSETRON 4 MG PO TBDP: 4.0000 mg | ORAL_TABLET | Freq: Three times a day (TID) | ORAL | 0 refills | Status: AC | PRN

## 2023-04-24 NOTE — Progress Notes (Unsigned)
Established Patient Office Visit  Subjective   Patient ID: Jamie Cameron, female    DOB: 06-01-1966  Age: 57 y.o. MRN: [id number]  Chief Complaint  Patient presents with   Annual Exam    HPI  {History (Optional):23778} Jamie Cameron is here for physical exam.  Generally doing well.  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  She has past history of endometriosis, migraine headaches, IBS.  She has had some recent right lower extremity pain for past few days.  Does a lot of driving with work.  No edema.  No history of DVT.  Pain is mostly achy and calf and thigh region.  No back pain.  No lower extremity numbness or weakness.  No dyspnea.  No chest pains or pleuritic pain.  She is a vegan and is requesting B12 level.  Maintenance reviewed:  Health Maintenance  Topic Date Due   MAMMOGRAM  11/12/2022   COVID-19 Vaccine (4 - 2023-24 season) 02/03/2023   Zoster Vaccines- Shingrix (1 of 2) 07/25/2023 (Originally 12/21/2016)   Cervical Cancer Screening (HPV/Pap Cotest)  11/10/2025   DTaP/Tdap/Td (4 - Td or Tdap) 06/04/2030   Colonoscopy  12/14/2031   INFLUENZA VACCINE  Completed   Hepatitis C Screening  Completed   HIV Screening  Completed   HPV VACCINES  Aged Out   Family history-father is 56 and doing generally very well.  Both her father and brother had prostate cancer.  Mom died age 22 of what sounds like complications of nonalcoholic fatty liver disease related cirrhosis.  She also had a history of atrial fibrillation.  Several aunts with breast cancer.  Social history-she is divorced.  She works as a Tour manager.  Non-smoker.  No regular alcohol.  Past Medical History:  Diagnosis Date   Allergy    Anemia    Asthma    COVID-19 virus infection    Endometriosis    Hypokalemia    Left ovarian cyst    See Epic pelvic US 03/03/19.   Migraine    Pneumonia    Past Surgical History:  Procedure Laterality Date   BREAST SURGERY  1999   Left benign lymph node    CHOLECYSTECTOMY     FOOT SURGERY     PELVIC LAPAROSCOPY  1996   endometriosis    reports that she has never smoked. She has never used smokeless tobacco. She reports that she does not drink alcohol and does not use drugs. family history includes Breast cancer in her maternal grandmother; Breast cancer (age of onset: 22) in her paternal aunt; Breast cancer (age of onset: 43) in her maternal aunt; Cancer in her maternal grandmother; Crohn's disease in her sister; Diabetes in her maternal grandfather and mother; Heart attack in her mother; Hypertension in her father and mother. Allergies  Allergen Reactions   Latex Hives, Shortness Of Breath, Itching, Swelling and Rash   Sulfacetamide Sodium Nausea And Vomiting   Prednisone Nausea And Vomiting    Review of Systems  Constitutional:  Negative for chills, fever, malaise/fatigue and weight loss.  HENT:  Negative for hearing loss.   Eyes:  Negative for blurred vision and double vision.  Respiratory:  Negative for cough and shortness of breath.   Cardiovascular:  Negative for chest pain, palpitations and leg swelling.  Gastrointestinal:  Negative for abdominal pain, blood in stool, constipation and diarrhea.  Genitourinary:  Negative for dysuria.  Skin:  Negative for rash.  Neurological:  Negative for dizziness, speech change, seizures, loss of  consciousness and headaches.  Psychiatric/Behavioral:  Negative for depression.       Objective:     BP 130/82 (BP Location: Left Arm, Patient Position: Sitting, Cuff Size: Normal)   Pulse 80   Temp (!) 97.4 F (36.3 C) (Oral)   Ht 5' 1.81" (1.57 m)   Wt 109 lb 9.6 oz (49.7 kg)   SpO2 99%   BMI 20.17 kg/m  {Vitals History (Optional):23777}  Physical Exam Vitals reviewed.  Constitutional:      Appearance: She is well-developed.  HENT:     Head: Normocephalic and atraumatic.  Eyes:     Pupils: Pupils are equal, round, and reactive to light.  Neck:     Thyroid: No thyromegaly.   Cardiovascular:     Rate and Rhythm: Normal rate and regular rhythm.     Heart sounds: Normal heart sounds. No murmur heard. Pulmonary:     Effort: No respiratory distress.     Breath sounds: Normal breath sounds. No wheezing or rales.  Abdominal:     General: Bowel sounds are normal. There is no distension.     Palpations: Abdomen is soft. There is no mass.     Tenderness: There is no abdominal tenderness. There is no guarding or rebound.  Musculoskeletal:        General: Normal range of motion.     Cervical back: Normal range of motion and neck supple.     Comments: Straight leg raise are negative bilaterally  Lymphadenopathy:     Cervical: No cervical adenopathy.  Skin:    Findings: No rash.  Neurological:     Mental Status: She is alert and oriented to person, place, and time.     Cranial Nerves: No cranial nerve deficit.     Comments: Symmetric deep tendon reflexes lower extremities.  Full strength with plantarflexion, dorsiflexion, and knee extension right lower extremity  Psychiatric:        Behavior: Behavior normal.        Thought Content: Thought content normal.        Judgment: Judgment normal.      No results found for any visits on 04/24/23.  {Labs (Optional):23779}  The 10-year ASCVD risk score (Arnett DK, et al., 2019) is: 2%    Assessment & Plan:   Problem List Items Addressed This Visit   None Visit Diagnoses     Physical exam    -  Primary   Relevant Orders   Lipid panel   CBC with Differential/Platelet   Basic metabolic panel   Hepatic function panel   Vegan diet       Relevant Orders   Vitamin B12       Return in about 4 months (around 08/22/2023).    Evelena Peat, MD

## 2023-04-25 LAB — LIPID PANEL
Cholesterol: 186 mg/dL (ref 0–200)
HDL: 55.5 mg/dL (ref >39.00)
LDL Cholesterol: 112 mg/dL — ABNORMAL HIGH (ref 0–99)
NonHDL: 130.67
Total CHOL/HDL Ratio: 3
Triglycerides: 94 mg/dL (ref 0.0–149.0)
VLDL: 18.8 mg/dL (ref 0.0–40.0)

## 2023-04-25 LAB — HEPATIC FUNCTION PANEL
ALT: 32 U/L (ref 0–35)
AST: 30 U/L (ref 0–37)
Albumin: 4.4 g/dL (ref 3.5–5.2)
Alkaline Phosphatase: 59 U/L (ref 39–117)
Bilirubin, Direct: 0.1 mg/dL (ref 0.0–0.3)
Total Bilirubin: 0.4 mg/dL (ref 0.2–1.2)
Total Protein: 6 g/dL (ref 6.0–8.3)

## 2023-04-25 LAB — CBC WITH DIFFERENTIAL/PLATELET
Basophils Absolute: 0 10*3/uL (ref 0.0–0.1)
Basophils Relative: 0.2 % (ref 0.0–3.0)
Eosinophils Absolute: 0.1 10*3/uL (ref 0.0–0.7)
Eosinophils Relative: 1.2 % (ref 0.0–5.0)
HCT: 42.3 % (ref 36.0–46.0)
Hemoglobin: 14 g/dL (ref 12.0–15.0)
Lymphocytes Relative: 31.9 % (ref 12.0–46.0)
Lymphs Abs: 2.1 10*3/uL (ref 0.7–4.0)
MCHC: 33.2 g/dL (ref 30.0–36.0)
MCV: 95.3 fL (ref 78.0–100.0)
Monocytes Absolute: 0.6 10*3/uL (ref 0.1–1.0)
Monocytes Relative: 8.7 % (ref 3.0–12.0)
Neutro Abs: 3.8 10*3/uL (ref 1.4–7.7)
Neutrophils Relative %: 58 % (ref 43.0–77.0)
Platelets: 281 10*3/uL (ref 150.0–400.0)
RBC: 4.44 Mil/uL (ref 3.87–5.11)
RDW: 12.9 % (ref 11.5–15.5)
WBC: 6.5 10*3/uL (ref 4.0–10.5)

## 2023-04-25 LAB — BASIC METABOLIC PANEL
BUN: 11 mg/dL (ref 6–23)
CO2: 29 meq/L (ref 19–32)
Calcium: 9.6 mg/dL (ref 8.4–10.5)
Chloride: 106 meq/L (ref 96–112)
Creatinine, Ser: 0.65 mg/dL (ref 0.40–1.20)
GFR: 98.48 mL/min (ref >60.00)
Glucose, Bld: 73 mg/dL (ref 70–99)
Potassium: 4 meq/L (ref 3.5–5.1)
Sodium: 143 meq/L (ref 135–145)

## 2023-04-25 LAB — VITAMIN B12: Vitamin B-12: 258 pg/mL (ref 211–911)

## 2023-04-25 MED ORDER — QSYMIA 15-92 MG PO CP24: 1.0000 | ORAL_CAPSULE | Freq: Every day | ORAL | 3 refills | Status: DC

## 2023-08-16 ENCOUNTER — Ambulatory Visit (INDEPENDENT_AMBULATORY_CARE_PROVIDER_SITE_OTHER): Admitting: Family Medicine

## 2023-08-16 ENCOUNTER — Encounter: Payer: Self-pay | Admitting: Family Medicine

## 2023-08-16 VITALS — BP 110/60 | HR 96 | Temp 98.2°F | Wt 106.8 lb

## 2023-08-16 DIAGNOSIS — J0100 Acute maxillary sinusitis, unspecified: Secondary | ICD-10-CM | POA: Diagnosis not present

## 2023-08-16 MED ORDER — DOXYCYCLINE HYCLATE 100 MG PO CAPS: 100.0000 mg | ORAL_CAPSULE | Freq: Two times a day (BID) | ORAL | 0 refills | Status: DC

## 2023-08-16 NOTE — Patient Instructions (Signed)
 Consider OTC Flonase or Nasacort AQ.

## 2023-08-16 NOTE — Progress Notes (Signed)
 Established Patient Office Visit  Subjective   Patient ID: Jamie Cameron, female    DOB: 1965-09-17  Age: 58 y.o. MRN: [id number]  Chief Complaint  Patient presents with   Cough    Patient complains of non productive cough, x2 weeks, Tried Mucinex   Nasal Congestion    Patient complains of nasal congestions, x2 weeks     HPI   Jamie Cameron is seen with some ongoing respiratory symptoms.  She states that her symptoms started over 2 weeks ago.  She initially had some fever of 101.4.  She went to practice up in [place] where she is working and had flu and COVID testing which were negative.  She apparently was treated with Levaquin 750 mg once daily for 5 days.  She has had some ongoing nasal congestion, daily headaches, bilateral maxillary pressure.  She has had some greenish nasal discharge.  Has taken over-the-counter Mucinex without much improvement.  Also using Afrin nose spray.  She had leftover albuterol which has helped cough slightly.  She had also been prescribed hydrocodone cough syrup from practice up in [place].  Denies any bloody nasal discharge.  No recurrent fever.  Non-smoker.  Also relates some mild left ankle swelling and soreness.  No calf pain.  Pain is medial and just posterior to the medial malleolus.  No Achilles tenderness.  No visible leg swelling.  Symptoms did start after initiation of Levaquin recently.  Past Medical History:  Diagnosis Date   Allergy    Anemia    Asthma    COVID-19 virus infection    Endometriosis    Hypokalemia    Left ovarian cyst    See Epic pelvic US 03/03/19.   Migraine    Pneumonia    Past Surgical History:  Procedure Laterality Date   BREAST SURGERY  1999   Left benign lymph node   CHOLECYSTECTOMY     FOOT SURGERY     PELVIC LAPAROSCOPY  1996   endometriosis    reports that she has never smoked. She has never used smokeless tobacco. She reports that she does not drink alcohol and does not use drugs. family history includes  Breast cancer in her maternal grandmother; Breast cancer (age of onset: 78) in her paternal aunt; Breast cancer (age of onset: 72) in her maternal aunt; Cancer in her maternal grandmother; Crohn's disease in her sister; Diabetes in her maternal grandfather and mother; Heart attack in her mother; Hypertension in her father and mother. Allergies  Allergen Reactions   Latex Hives, Shortness Of Breath, Itching, Swelling and Rash   Sulfacetamide Sodium Nausea And Vomiting   Prednisone Nausea And Vomiting    Review of Systems  Constitutional:  Positive for malaise/fatigue. Negative for chills and fever.  HENT:  Positive for congestion and sinus pain. Negative for ear pain.   Respiratory:  Positive for cough.   Cardiovascular:  Negative for chest pain.      Objective:     BP 110/60 (BP Location: Left Arm, Patient Position: Sitting, Cuff Size: Normal)   Pulse 96   Temp 98.2 F (36.8 C) (Oral)   Wt 106 lb 12.8 oz (48.4 kg)   SpO2 98%   BMI 19.65 kg/m  BP Readings from Last 3 Encounters:  08/16/23 110/60  04/24/23 130/82  12/07/22 124/80   Wt Readings from Last 3 Encounters:  08/16/23 106 lb 12.8 oz (48.4 kg)  04/24/23 109 lb 9.6 oz (49.7 kg)  12/07/22 106 lb 1.6 oz (48.1 kg)  Physical Exam Vitals reviewed.  Constitutional:      General: She is not in acute distress.    Appearance: She is not ill-appearing.  HENT:     Right Ear: Tympanic membrane normal.     Left Ear: Tympanic membrane normal.     Nose:     Comments: Nasal mucosa slightly erythematous.  No visible purulent drainage.  No visible polyps.    Mouth/Throat:     Mouth: Mucous membranes are dry.     Pharynx: Oropharynx is clear.  Cardiovascular:     Rate and Rhythm: Normal rate and regular rhythm.  Pulmonary:     Effort: Pulmonary effort is normal.     Breath sounds: Normal breath sounds. No wheezing or rales.  Musculoskeletal:     Cervical back: Neck supple.     Comments: Left foot and ankle examined.   No visible edema at this time.  No erythema.  Full range of motion with dorsiflexion plantarflexion.  No Achilles tenderness.  No calf tenderness.  Mild tenderness just posterior and inferior to the medial malleolus near the posterior tibial tendon  Lymphadenopathy:     Cervical: No cervical adenopathy.  Neurological:     Mental Status: She is alert.      No results found for any visits on 08/16/23.    The 10-year ASCVD risk score (Arnett DK, et al., 2019) is: 1.5%    Assessment & Plan:   Patient presents with over 2-week history of respiratory symptoms including predominately nasal congestion and cough.  Has some initial fever.  Was treated briefly with 5 days of Levaquin but no resolution.  She is having frequent headaches and ongoing bilateral maxillary facial pressure and persistent greenish nasal discharge.  May have some mild tendinitis left ankle.  No weakness.  -We discussed that most respiratory infections are viral. -Given persistence of headaches and facial pain we decided to go and treat with doxycycline 100 mg twice daily for 10 more days. -Consider over-the-counter Mucinex 1200 mg twice daily -Avoid regular or prolonged use of Afrin -Consider over-the-counter Flonase or Nasacort AQ -Stay well-hydrated -Be in touch for any persistent or worsening symptoms  Evelena Peat, MD

## 2023-10-22 ENCOUNTER — Encounter: Payer: Self-pay | Admitting: Family Medicine

## 2023-10-23 MED ORDER — QSYMIA 15-92 MG PO CP24: 1.0000 | ORAL_CAPSULE | Freq: Every day | ORAL | 3 refills | Status: DC

## 2023-10-23 NOTE — Telephone Encounter (Signed)
 [facility] she [facility].  Can also send in request for refill of her topical [facility]atin to [facility].  The topical lovastatin may need to be called in as a refill since they have to make that customized at the pharmacy  Marquetta Sit MD [facility]

## 2023-10-24 ENCOUNTER — Telehealth: Payer: Self-pay | Admitting: *Deleted

## 2023-10-24 MED ORDER — QSYMIA 15-92 MG PO CP24: 1.0000 | ORAL_CAPSULE | Freq: Every day | ORAL | 3 refills | Status: DC

## 2023-10-24 NOTE — Telephone Encounter (Signed)
 Copied from CRM [id number]. Topic: Clinical - Medication Question >> Oct 24, 2023 10:41 AM Luane Rumps D wrote: Reason for CRM: Patient calling for status of Qsymia  fill, confirming pharmacy will be [facility] on [address]. Patient requesting call/text instead of MyChart for update.

## 2024-01-27 ENCOUNTER — Telehealth: Payer: Self-pay | Admitting: Family Medicine

## 2024-01-27 NOTE — Telephone Encounter (Signed)
 Patient provided number to [facility] health billing to further assist

## 2024-01-27 NOTE — Telephone Encounter (Signed)
 Copied from CRM #[id number]. Topic: General - Billing Inquiry >> Jan 27, 2024  2:56 PM [place] E wrote: Reason for CRM: Patient had a visit back on March 27th, 2024 for flank pain which she ended up needing an emergent ultrasound for a kidney stone. Patient was with [employer] at the time and she just got a bill for $1,300 for this ultrasound. Patient stated the provider needs to put emergent on that ultrasound order for insurance to cover. Patient questioning if that claim could be resubmitted with that added information. Callback number for patient is [phone].

## 2024-02-25 ENCOUNTER — Ambulatory Visit: Payer: Self-pay

## 2024-02-25 NOTE — Telephone Encounter (Signed)
 FYI Only or Action Required?: FYI only for provider.  Patient was last seen in primary care on 08/16/2023 by Micheal Wolm ORN, MD.  Called Nurse Triage reporting Dysuria and Flank Pain.  Symptoms began several days ago.  Interventions attempted: OTC medications: AZO.  Symptoms are: urinary urgency, frequency, burning/pain/pressure; right flank pain gradually worsening.  Triage Disposition: See Physician Within 24 Hours (overriding See HCP Within 4 Hours (Or PCP Triage))  Patient/caregiver understands and will follow disposition?: Yes            Copied from CRM #[id number]. 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   1. SEVERITY: How bad is the pain?  (e.g., Scale 1-10; mild, moderate, or severe)     9/10. Burning, discomfort.  2. FREQUENCY: How many times have you had painful urination today?      12 times.  3. PATTERN: Is pain present every time you urinate or just sometimes?      Sometimes burning, mainly painful. A lot of pressure  4. ONSET: When did the painful urination start?      2 days.  5. FEVER: Do you have a fever? If Yes, ask: What is your temperature, how was it measured, and when did it start?     No.  6. PAST UTI: Have you had a urine infection before? If Yes, ask: When was the last time? and What happened that time?      Yes. She states she has had UTI when she had kidney stones. About a year ago. She states she was treated with Myrbetriq.  7. CAUSE: What do  you think is causing the painful urination?  (e.g., UTI, scratch, Herpes sore)     UTI. She states she took a home urine test, positive for leukocytes.  8. OTHER SYMPTOMS: Do you have any other symptoms? (e.g., blood in urine, flank pain, genital sores, urgency, vaginal discharge)     Flank pain (right side), urinary urgency and frequency. Denies blood in urine, fever, nausea, vomiting. She states she has been taking OTC AZO.  9. PREGNANCY: Is there any chance you are pregnant? When was your last menstrual period?     N/A.  Protocols used: Urination Pain - Female-A-AH

## 2024-02-26 ENCOUNTER — Encounter: Payer: Self-pay | Admitting: Family Medicine

## 2024-02-26 ENCOUNTER — Ambulatory Visit: Admitting: Family Medicine

## 2024-02-26 VITALS — BP 96/74 | HR 79 | Temp 98.2°F | Wt 107.0 lb

## 2024-02-26 DIAGNOSIS — D509 Iron deficiency anemia, unspecified: Secondary | ICD-10-CM

## 2024-02-26 DIAGNOSIS — M810 Age-related osteoporosis without current pathological fracture: Secondary | ICD-10-CM | POA: Insufficient documentation

## 2024-02-26 DIAGNOSIS — N390 Urinary tract infection, site not specified: Secondary | ICD-10-CM

## 2024-02-26 LAB — LIPID PANEL
Cholesterol: 199 mg/dL (ref 0–200)
HDL: 67.7 mg/dL (ref >39.00)
LDL Cholesterol: 112 mg/dL — ABNORMAL HIGH (ref 0–99)
NonHDL: 131.46
Total CHOL/HDL Ratio: 3
Triglycerides: 95 mg/dL (ref 0.0–149.0)
VLDL: 19 mg/dL (ref 0.0–40.0)

## 2024-02-26 LAB — HEPATIC FUNCTION PANEL
ALT: 25 U/L (ref 0–35)
AST: 22 U/L (ref 0–37)
Albumin: 4.4 g/dL (ref 3.5–5.2)
Alkaline Phosphatase: 49 U/L (ref 39–117)
Bilirubin, Direct: 0.1 mg/dL (ref 0.0–0.3)
Total Bilirubin: 0.4 mg/dL (ref 0.2–1.2)
Total Protein: 6.3 g/dL (ref 6.0–8.3)

## 2024-02-26 LAB — CBC WITH DIFFERENTIAL/PLATELET
Basophils Absolute: 0 K/uL (ref 0.0–0.1)
Basophils Relative: 0.7 % (ref 0.0–3.0)
Eosinophils Absolute: 0.1 K/uL (ref 0.0–0.7)
Eosinophils Relative: 1.7 % (ref 0.0–5.0)
HCT: 39.1 % (ref 36.0–46.0)
Hemoglobin: 13.3 g/dL (ref 12.0–15.0)
Lymphocytes Relative: 27.2 % (ref 12.0–46.0)
Lymphs Abs: 1.8 K/uL (ref 0.7–4.0)
MCHC: 34 g/dL (ref 30.0–36.0)
MCV: 92.4 fl (ref 78.0–100.0)
Monocytes Absolute: 0.5 K/uL (ref 0.1–1.0)
Monocytes Relative: 8.5 % (ref 3.0–12.0)
Neutro Abs: 4 K/uL (ref 1.4–7.7)
Neutrophils Relative %: 61.9 % (ref 43.0–77.0)
Platelets: 282 K/uL (ref 150.0–400.0)
RBC: 4.23 Mil/uL (ref 3.87–5.11)
RDW: 13.2 % (ref 11.5–15.5)
WBC: 6.4 K/uL (ref 4.0–10.5)

## 2024-02-26 LAB — POC URINALSYSI DIPSTICK (AUTOMATED)
Bilirubin, UA: NEGATIVE
Blood, UA: NEGATIVE
Glucose, UA: NEGATIVE
Ketones, UA: NEGATIVE
Leukocytes, UA: NEGATIVE
Nitrite, UA: NEGATIVE
Protein, UA: POSITIVE — AB
Spec Grav, UA: 1.025 (ref 1.010–1.025)
Urobilinogen, UA: 0.2 U/dL
pH, UA: 6 (ref 5.0–8.0)

## 2024-02-26 LAB — BASIC METABOLIC PANEL WITH GFR
BUN: 19 mg/dL (ref 6–23)
CO2: 30 meq/L (ref 19–32)
Calcium: 9.2 mg/dL (ref 8.4–10.5)
Chloride: 104 meq/L (ref 96–112)
Creatinine, Ser: 0.66 mg/dL (ref 0.40–1.20)
GFR: 97.54 mL/min (ref >60.00)
Glucose, Bld: 97 mg/dL (ref 70–99)
Potassium: 3.1 meq/L — ABNORMAL LOW (ref 3.5–5.1)
Sodium: 141 meq/L (ref 135–145)

## 2024-02-26 LAB — IBC + FERRITIN
Ferritin: 14.7 ng/mL (ref 10.0–291.0)
Iron: 104 ug/dL (ref 42–145)
Saturation Ratios: 26.2 % (ref 20.0–50.0)
TIBC: 397.6 ug/dL (ref 250.0–450.0)
Transferrin: 284 mg/dL (ref 212.0–360.0)

## 2024-02-26 LAB — TSH: TSH: 1.47 u[IU]/mL (ref 0.35–5.50)

## 2024-02-26 MED ORDER — ALENDRONATE SODIUM 35 MG PO TABS: 35.0000 mg | ORAL_TABLET | ORAL | Status: AC

## 2024-02-26 MED ORDER — CIPROFLOXACIN HCL 500 MG PO TABS: 500.0000 mg | ORAL_TABLET | Freq: Two times a day (BID) | ORAL | 0 refills | Status: AC

## 2024-02-26 NOTE — Progress Notes (Signed)
   Subjective:    Patient ID: Jamie Cameron, female    DOB: 1966-05-19, 58 y.o.   MRN: [id number]  HPI Here for 3 days of urinary burning and urgency. She had some left flank pain at first, but not now. No fever or nausea.    Review of Systems  Constitutional: Negative.   Respiratory: Negative.    Cardiovascular: Negative.   Genitourinary:  Positive for dysuria, flank pain, frequency and urgency. Negative for hematuria.       Objective:   Physical Exam Constitutional:      Appearance: Normal appearance.  Cardiovascular:     Rate and Rhythm: Normal rate and regular rhythm.     Pulses: Normal pulses.     Heart sounds: Normal heart sounds.  Pulmonary:     Effort: Pulmonary effort is normal.     Breath sounds: Normal breath sounds.  Abdominal:     Tenderness: There is no right CVA tenderness or left CVA tenderness.  Neurological:     Mental Status: She is alert.           Assessment & Plan:  UTI, treat with 7 days of Cipro . Culture the sample.  Garnette Olmsted, MD

## 2024-02-26 NOTE — Addendum Note (Signed)
 Addended by: LADONNA INOCENTE SAILOR on: 02/26/2024 02:21 PM   Modules accepted: Orders

## 2024-02-27 ENCOUNTER — Other Ambulatory Visit: Payer: Self-pay | Admitting: Family Medicine

## 2024-02-27 ENCOUNTER — Ambulatory Visit: Payer: Self-pay | Admitting: Family Medicine

## 2024-02-27 DIAGNOSIS — E876 Hypokalemia: Secondary | ICD-10-CM

## 2024-02-27 MED ORDER — POTASSIUM CHLORIDE CRYS ER 20 MEQ PO TBCR: 20.0000 meq | EXTENDED_RELEASE_TABLET | Freq: Every day | ORAL | 0 refills | Status: AC

## 2024-02-27 NOTE — Telephone Encounter (Signed)
 Copied from CRM [id number]. Topic: Clinical - Medication Refill >> Feb 27, 2024 12:26 PM [place] wrote: Medication: potassium chloride  SA (KLOR-CON  M) 20 MEQ tablet  Has the patient contacted their pharmacy? Yes (Agent: If no, request that the patient contact the pharmacy for the refill. If patient does not wish to contact the pharmacy document the reason why and proceed with request.) (Agent: If yes, when and what did the pharmacy advise?)  This is the patient's preferred pharmacy:  [id number] [place], [place] - [address] AT [facility] [facility] [address] [place] [address] Phone: [phone] Fax: [phone]   Is this the correct pharmacy for this prescription? Yes If no, delete pharmacy and type the correct one.   Has the prescription been filled recently? Yes  Is the patient out of the medication? Yes  Has the patient been seen for an appointment in the last year OR does the patient have an upcoming appointment? Yes  Can we respond through MyChart? Yes  Agent: Please be advised that Rx refills may take up to 3 business days. We ask that you follow-up with your pharmacy.

## 2024-02-27 NOTE — Telephone Encounter (Signed)
 Med refill

## 2024-02-28 LAB — URINE CULTURE
MICRO NUMBER:: 17011272
Result:: NO GROWTH
SPECIMEN QUALITY:: ADEQUATE

## 2024-04-21 ENCOUNTER — Other Ambulatory Visit: Payer: Self-pay | Admitting: Family Medicine

## 2024-04-21 MED ORDER — PHENTERMINE-TOPIRAMATE ER 15-92 MG PO CP24: 1.0000 | ORAL_CAPSULE | Freq: Every day | ORAL | 2 refills | Status: AC

## 2024-04-21 NOTE — Telephone Encounter (Signed)
 Copied from CRM #[id number]. Topic: Clinical - Medication Refill >> Apr 21, 2024 11:16 AM Shereese L wrote: Medication: Phentermine-Topiramate (QSYMIA ) 15-92 MG CP24  Has the patient contacted their pharmacy? Yes (Agent: If no, request that the patient contact the pharmacy for the refill. If patient does not wish to contact the pharmacy document the reason why and proceed with request.) (Agent: If yes, when and what did the pharmacy advise?)  This is the patient's preferred pharmacy:  [id number] [place], [place] - [address] AT [facility] [address] [facility] [address] [facility] [address] [place] [address] Phone: [phone] Fax: [phone]   Is this the correct pharmacy for this prescription? Yes If no, delete pharmacy and type the correct one.   Has the prescription been filled recently? Yes  Is the patient out of the medication? Yes  Has the patient been seen for an appointment in the last year OR does the patient have an upcoming appointment? Yes  Can we respond through MyChart? Yes  Agent: Please be advised that Rx refills may take up to 3 business days. We ask that you follow-up with your pharmacy.

## 2024-06-16 ENCOUNTER — Other Ambulatory Visit (HOSPITAL_COMMUNITY): Payer: Self-pay

## 2024-07-02 ENCOUNTER — Encounter: Payer: Self-pay | Admitting: Family Medicine
